# Patient Record
Sex: Female | Born: 1955 | Race: Black or African American | Hispanic: No | Smoking: Never smoker
Health system: Southern US, Community
[De-identification: ages and names within clinical notes are randomized; demographics above are authoritative.]

## PROBLEM LIST (undated history)

## (undated) DIAGNOSIS — H579 Unspecified disorder of eye and adnexa: Secondary | ICD-10-CM

## (undated) DIAGNOSIS — E119 Type 2 diabetes mellitus without complications: Secondary | ICD-10-CM

## (undated) DIAGNOSIS — E785 Hyperlipidemia, unspecified: Secondary | ICD-10-CM

## (undated) HISTORY — DX: Unspecified disorder of eye and adnexa: H57.9

## (undated) HISTORY — PX: KNEE SURGERY: SHX244

## (undated) HISTORY — DX: Type 2 diabetes mellitus without complications: E11.9

## (undated) HISTORY — PX: REDUCTION MAMMAPLASTY: SUR839

## (undated) HISTORY — DX: Hyperlipidemia, unspecified: E78.5

## (undated) HISTORY — PX: ABDOMINAL HYSTERECTOMY: SHX81

## (undated) HISTORY — PX: BREAST BIOPSY: SHX20

---

## 1998-09-06 ENCOUNTER — Emergency Department (HOSPITAL_COMMUNITY): Admission: EM | Admit: 1998-09-06 | Discharge: 1998-09-06 | Payer: Self-pay | Admitting: Emergency Medicine

## 1998-09-06 ENCOUNTER — Encounter: Payer: Self-pay | Admitting: Emergency Medicine

## 1999-11-29 ENCOUNTER — Ambulatory Visit (HOSPITAL_COMMUNITY): Admission: RE | Admit: 1999-11-29 | Discharge: 1999-11-29 | Payer: Self-pay | Admitting: Family Medicine

## 1999-11-29 ENCOUNTER — Encounter: Payer: Self-pay | Admitting: Family Medicine

## 2000-03-16 ENCOUNTER — Encounter: Admission: RE | Admit: 2000-03-16 | Discharge: 2000-04-23 | Payer: Self-pay | Admitting: Family Medicine

## 2000-04-03 ENCOUNTER — Ambulatory Visit: Admission: RE | Admit: 2000-04-03 | Discharge: 2000-04-03 | Payer: Self-pay | Admitting: Pulmonary Disease

## 2000-04-03 ENCOUNTER — Encounter: Payer: Self-pay | Admitting: Pulmonary Disease

## 2000-05-03 ENCOUNTER — Ambulatory Visit: Admission: RE | Admit: 2000-05-03 | Discharge: 2000-05-03 | Payer: Self-pay | Admitting: Pulmonary Disease

## 2002-08-22 ENCOUNTER — Inpatient Hospital Stay (HOSPITAL_COMMUNITY): Admission: EM | Admit: 2002-08-22 | Discharge: 2002-08-24 | Payer: Self-pay | Admitting: Family Medicine

## 2003-06-05 ENCOUNTER — Other Ambulatory Visit: Admission: RE | Admit: 2003-06-05 | Discharge: 2003-06-05 | Payer: Self-pay | Admitting: *Deleted

## 2003-06-23 ENCOUNTER — Encounter: Payer: Self-pay | Admitting: *Deleted

## 2003-06-23 ENCOUNTER — Ambulatory Visit (HOSPITAL_COMMUNITY): Admission: RE | Admit: 2003-06-23 | Discharge: 2003-06-23 | Payer: Self-pay | Admitting: *Deleted

## 2003-07-01 ENCOUNTER — Other Ambulatory Visit: Admission: RE | Admit: 2003-07-01 | Discharge: 2003-07-01 | Payer: Self-pay | Admitting: *Deleted

## 2003-08-17 ENCOUNTER — Encounter: Payer: Self-pay | Admitting: *Deleted

## 2003-08-17 ENCOUNTER — Ambulatory Visit (HOSPITAL_COMMUNITY): Admission: RE | Admit: 2003-08-17 | Discharge: 2003-08-17 | Payer: Self-pay | Admitting: *Deleted

## 2003-10-27 ENCOUNTER — Encounter (INDEPENDENT_AMBULATORY_CARE_PROVIDER_SITE_OTHER): Payer: Self-pay | Admitting: *Deleted

## 2003-10-27 ENCOUNTER — Inpatient Hospital Stay (HOSPITAL_COMMUNITY): Admission: RE | Admit: 2003-10-27 | Discharge: 2003-10-30 | Payer: Self-pay | Admitting: *Deleted

## 2006-01-21 ENCOUNTER — Emergency Department (HOSPITAL_COMMUNITY): Admission: EM | Admit: 2006-01-21 | Discharge: 2006-01-21 | Payer: Self-pay | Admitting: Emergency Medicine

## 2006-04-04 ENCOUNTER — Encounter: Payer: Self-pay | Admitting: Emergency Medicine

## 2006-10-19 ENCOUNTER — Emergency Department (HOSPITAL_COMMUNITY): Admission: EM | Admit: 2006-10-19 | Discharge: 2006-10-19 | Payer: Self-pay | Admitting: Emergency Medicine

## 2010-12-22 ENCOUNTER — Emergency Department (HOSPITAL_COMMUNITY)
Admission: EM | Admit: 2010-12-22 | Discharge: 2010-12-23 | Payer: Self-pay | Source: Home / Self Care | Admitting: Emergency Medicine

## 2010-12-24 ENCOUNTER — Encounter: Payer: Self-pay | Admitting: Oncology

## 2010-12-26 LAB — CBC
MCH: 27.6 pg (ref 26.0–34.0)
MCHC: 33.6 g/dL (ref 30.0–36.0)
MCV: 82.1 fL (ref 78.0–100.0)
Platelets: 329 10*3/uL (ref 150–400)

## 2010-12-26 LAB — DIFFERENTIAL
Basophils Relative: 1 % (ref 0–1)
Eosinophils Absolute: 0.1 10*3/uL (ref 0.0–0.7)
Monocytes Absolute: 0.5 10*3/uL (ref 0.1–1.0)
Monocytes Relative: 6 % (ref 3–12)

## 2010-12-26 LAB — POCT I-STAT, CHEM 8
BUN: 14 mg/dL (ref 6–23)
Chloride: 102 mEq/L (ref 96–112)
TCO2: 24 mmol/L (ref 0–100)

## 2010-12-26 LAB — POCT CARDIAC MARKERS
CKMB, poc: <1 ng/mL — ABNORMAL LOW (ref 1.0–8.0)
Myoglobin, poc: 33.6 ng/mL (ref 12–200)
Troponin i, poc: <0.05 ng/mL (ref 0.00–0.09)

## 2011-04-09 ENCOUNTER — Emergency Department (HOSPITAL_COMMUNITY)
Admission: EM | Admit: 2011-04-09 | Discharge: 2011-04-10 | Disposition: A | Discharge status: Home / Self Care | Payer: BC Managed Care – PPO | Attending: Emergency Medicine | Admitting: Emergency Medicine

## 2011-04-09 DIAGNOSIS — R112 Nausea with vomiting, unspecified: Secondary | ICD-10-CM | POA: Insufficient documentation

## 2011-04-09 DIAGNOSIS — R197 Diarrhea, unspecified: Secondary | ICD-10-CM | POA: Insufficient documentation

## 2011-04-09 DIAGNOSIS — R51 Headache: Secondary | ICD-10-CM | POA: Insufficient documentation

## 2011-04-09 DIAGNOSIS — Z79899 Other long term (current) drug therapy: Secondary | ICD-10-CM | POA: Insufficient documentation

## 2011-04-09 DIAGNOSIS — E119 Type 2 diabetes mellitus without complications: Secondary | ICD-10-CM | POA: Insufficient documentation

## 2011-04-09 DIAGNOSIS — Z794 Long term (current) use of insulin: Secondary | ICD-10-CM | POA: Insufficient documentation

## 2011-04-10 ENCOUNTER — Emergency Department (HOSPITAL_COMMUNITY): Payer: BC Managed Care – PPO

## 2011-04-10 LAB — BASIC METABOLIC PANEL
BUN: 8 mg/dL (ref 6–23)
Calcium: 9.6 mg/dL (ref 8.4–10.5)
Glucose, Bld: 280 mg/dL — ABNORMAL HIGH (ref 70–99)
Potassium: 3.8 mEq/L (ref 3.5–5.1)

## 2011-04-10 LAB — DIFFERENTIAL
Basophils Absolute: 0 10*3/uL (ref 0.0–0.1)
Eosinophils Absolute: 0.1 10*3/uL (ref 0.0–0.7)
Eosinophils Relative: 1 % (ref 0–5)
Lymphs Abs: 2.3 10*3/uL (ref 0.7–4.0)
Monocytes Absolute: 0.4 10*3/uL (ref 0.1–1.0)

## 2011-04-10 LAB — POCT CARDIAC MARKERS
CKMB, poc: <1 ng/mL — ABNORMAL LOW (ref 1.0–8.0)
Myoglobin, poc: 50 ng/mL (ref 12–200)
Troponin i, poc: <0.05 ng/mL (ref 0.00–0.09)

## 2011-04-10 LAB — URINALYSIS, ROUTINE W REFLEX MICROSCOPIC
Bilirubin Urine: NEGATIVE
Glucose, UA: >1000 mg/dL — AB
Hgb urine dipstick: NEGATIVE
Ketones, ur: >80 mg/dL — AB
Specific Gravity, Urine: 1.037 — ABNORMAL HIGH (ref 1.005–1.030)
pH: 6 (ref 5.0–8.0)

## 2011-04-10 LAB — CBC
MCHC: 34.7 g/dL (ref 30.0–36.0)
MCV: 82.4 fL (ref 78.0–100.0)
Platelets: 264 10*3/uL (ref 150–400)
RDW: 12.7 % (ref 11.5–15.5)
WBC: 7.5 10*3/uL (ref 4.0–10.5)

## 2011-04-10 LAB — URINE MICROSCOPIC-ADD ON

## 2011-04-10 LAB — GLUCOSE, CAPILLARY: Glucose-Capillary: 286 mg/dL — ABNORMAL HIGH (ref 70–99)

## 2011-04-21 NOTE — H&P (Signed)
NAME:  Kathleen Reid, Kathleen Reid                      ACCOUNT NO.:  [id number]   MEDICAL RECORD NO.:  [id number]                   PATIENT TYPE:  INP   LOCATION:  NA                                   FACILITY:  [facility]   PHYSICIAN:  Leland B. Earlene Plater, M.D.               DATE OF BIRTH:  07-02-1956   DATE OF ADMISSION:  10/27/2003  DATE OF DISCHARGE:                                HISTORY & PHYSICAL   CHIEF COMPLAINT:  Menorrhagia, uterine fibroids versus adenomyosis.   PROCEDURE:  Total abdominal hysterectomy, possible bilateral salpingo-  oophorectomy.   HISTORY OF PRESENT ILLNESS:  This is a 55 year old African American female  gravida 3, para 2, A1.  History of menorrhagia and anemia.  Has heavy  monthly bleeding and associated dysmenorrhea.  Pelvic ultrasound in the  office was suggestive of multiple uterine fibroids.  The patient was  considering uterine artery embolization.  MRI was performed in this regard  and was most suggestive of adenomyosis as opposed to discreet fibroids.  Therefore, patient would like to proceed with a hysterectomy.   Recently status post LEEP for CIN III with positive margin on the  ectocervical edge.  Preoperative endometrial biopsy was within normal  limits.   PAST MEDICAL HISTORY:  1. Anemia.  2. Palpitations.  3. Reflux.   PAST SURGICAL HISTORY:  1. Eye surgery.  2. LEEP.   MEDICATIONS:  1. Protonix 40 mg p.o. daily.  2. Atenolol 50 mg p.r.n. palpitations.   SOCIAL HISTORY:  No alcohol, tobacco, or other drugs.   FAMILY HISTORY:  Diabetes, heart disease, high blood pressure, breast  cancer, and thyroid cancer.   REVIEW OF SYSTEMS:  Otherwise noncontributory.   PHYSICAL EXAMINATION:  VITAL SIGNS:  Blood pressure 112/80, pulse 92, weight  200 pounds.  GENERAL:  Alert and oriented, in no acute distress.  SKIN:  Warm, dry, no lesions.  HEART:  Regular rate and rhythm.  LUNGS:  Clear to auscultation.  ABDOMEN:  Liver and spleen are normal.  No  hernias.  Uterus is palpable to  just below the umbilicus approximately 16-18 weeks size consistent with  fibroids.  PELVIC:  Normal external genitalia.  Vagina and cervix are clear.  Uterus  palpable as outlined above consistent with 16-18 weeks size fibroid uterus.  No adnexal masses palpable.  Urethra, bladder base, anus, perineum all  normal.   ASSESSMENT:  Menorrhagia, dysmenorrhea, and uterine fibroids versus  adenomyosis.  History of cervical dysplasia treated with loop  electrosurgical excision procedure.  Alternative treatments were discussed  including uterine artery embolization; however, patient desires definitive  therapy with hysterectomy.  Given the large size of her uterus she will  require a vertical midline incision and patient has been informed of this  preoperatively.  Operative risks discussed including infection, bleeding,  damage to surrounding organs, delayed healing, and deep venous thrombosis.  All questions answered.  The patient wishes to proceed.  Gerri Spore B. Earlene Plater, M.D.    WBD/MEDQ  D:  10/20/2003  T:  10/20/2003  Job:  [id number]   cc:   Pierce Crane, M.D.  [address] - [facility]  [place]  [place] [address]  Fax: [phone]

## 2011-04-21 NOTE — Discharge Summary (Signed)
NAME:  Kathleen Reid, Kathleen Reid                      ACCOUNT NO.:  [id number]   MEDICAL RECORD NO.:  [id number]                   PATIENT TYPE:  INP   LOCATION:  9306                                 [facility]ACILITY:  [facility]   PHYSICIAN:  Gerri Spore B. Earlene Plater, M.D.               DATE OF BIRTH:  08/05/1956   DATE OF ADMISSION:  10/27/2003  DATE OF DISCHARGE:  10/30/2003                                 DISCHARGE SUMMARY   ADMISSION DIAGNOSIS:  Symptomatic uterine fibroids.   DISCHARGE DIAGNOSIS:  Symptomatic uterine fibroids.   PROCEDURE:  Total abdominal hysterectomy.   HISTORY OF PRESENT ILLNESS:  For complete details please see the History and  Physical in the chart.  Briefly, the patient presented for definitive  treatment of symptomatic uterine fibroids with associated menorrhagia.   HOSPITAL COURSE:  On day of admission the patient underwent total abdominal  hysterectomy.  Findings at the time of surgery included changes of the  uterus consistent with fibroids and adenomyosis.   Postoperatively, the patient rapidly regained her ability to ambulate, void,  and tolerate a regular diet.  She did have fever on postoperative day #2.  Chest x-ray and blood cultures were negative and this resolved with  incentive spirometry.  She was discharged home on postoperative day #3 in  satisfactory condition.   DISCHARGE MEDICATIONS:  1. Tylox one to two p.o. q.4-6h. p.r.n. pain.  2. Motrin 600 mg one p.o. q.6h. p.r.n. pain.   FOLLOW-UP:  [facility], four weeks.   DISCHARGE INSTRUCTIONS:  1. No heavy lifting.  2. Nothing in the vagina for six weeks.  3. Call with fever, pain, nausea, vomiting, or other concerns.                                               Gerri Spore B. Earlene Plater, M.D.    WBD/MEDQ  D:  12/03/2003  T:  12/03/2003  Job:  [id number]

## 2011-04-21 NOTE — Discharge Summary (Signed)
NAME:  Kathleen Reid, Kathleen Reid                      ACCOUNT NO.:  [id number]   MEDICAL RECORD NO.:  [id number]                   PATIENT TYPE:  INP   LOCATION:  0365                                 FACILITY:  [facility]   PHYSICIAN:  Elana Alm. Nicholos Johns, M.D.               DATE OF BIRTH:  02/01/1956   DATE OF ADMISSION:  08/22/2002  DATE OF DISCHARGE:  08/24/2002                                 DISCHARGE SUMMARY   DISCHARGE DIAGNOSES:  1. Iron deficiency anemia.  2. Elevated blood sugars.   HISTORY OF PRESENT ILLNESS:  The patient was referred to the hospital by her  primary care physician, after workup of fatigue and shortness of breath  revealed a hemoglobin of 6.2.  She denied chest pain, abdominal pain, change  in the color of her stool; but had complained of some palpitations,  especially when going from sitting to standing.  She has been short of  breath with exertion for the past several minutes, but had been more so in  the past two weeks.  She has been having menorrhagia since May 2003; she  uses approximately one pad a day and during her 7 day monthly cycle she goes  through about nine pads a day.  This has been going on for the past five  months.  She thought maybe her stool was darker, although she still said it  as brown.  She does have some reflux symptoms as well, although this has  improved more recently with the addition of a PPI by her primary care  physician.  She did complain of some dry skin, but no constipation.  She has  had some weight loss secondary to her decrease in appetite; but no fevers,  rash or other constitutional symptoms.   PAST MEDICAL HISTORY:  1. Cluster migraines.  2. She has been borderline anemic since a teenager.  3. GERD.   ALLERGIES:  CODEINE (she gets strange reaction).   HEALTH MAINTENANCE:  She had a mammogram four years ago which was negative,  and a Pap two years ago which was also negative.   FAMILY HISTORY:  She has stomach cancer in  both her brother and her maternal  aunt.  Her niece has thyroid cancer.  There is a history of fibroids in her  family as well as coronary artery disease, hypertension and a strong family  history of diabetes.   SOCIAL HISTORY:  She is divorced with two children.  She works as a fifth  Psychiatrist.  She denies use of tobacco or alcohol, and  occasionally nonsteroidal use.  She has been quite active in the past, and  in fact has blacked out in karate.  Of note, she has not been able to do any  of her more strenuous activities (which she was used to) recently because of  fatigue.   PHYSICAL EXAMINATION:  VITAL SIGNS:  Her initial exam was significant  for  stable vital signs; pulse 79, blood pressure 131/80.  GENERAL:  She was pale appearing, but in no respiratory distress.  Sitting  comfortably in bed.  Although, she did get a little dyspneic whenever we  asked her to do any kind of strenuous activity.  HEENT:  Conjunctivae pale, but her pupils are equal, round and reactive to  light and accommodation.  There was no JVD and there is no icterus.  HEART:  Within normal limits.  LUNGS:  Within normal limits.  ABDOMEN:  Soft, nontender with positive bowel sounds.  EXTREMITIES:  She had no rash and no edema.  NEUROLOGIC:  Nonfocal.  RECTAL:  There were no lesions and there was a trace of brown stool, which  was guaiac negative.   INITIAL LABORATORY:  Hematocrit 21.5, with an MCV 55.  Reticulocyte count  1.1.  TSH was within normal limits.  Iron with less than 1%.  Ferritin level  1.0.  BMP -- Within normal limits, except for a blood sugar of 171 (this was  fasting).   HOSPITAL COURSE:  The patient was transfused two units.  Her post-  transfusion hematocrit the next day was 27.1, although she was tired and  getting a little short of breath with activity.  So, she was kept for  monitoring, especially since she did begin to have some vaginal bleeding  during the hospitalization.   Her hematocrit check the next morning was  stable at 28.5.  The patient was feeling much better and was much more  active and able to ambulate.  She was not complaining of any shortness of  breath with any activity.  Finger stick blood sugars were done before each  meal, since her serum blood sugar level was elevated on the labs done on  August 23, 2002.  They did range between 138 and 169, with a fasting one  of 138 in the morning.   The patient was stable and was discharged in stable condition on August 24, 2002.   DISCHARGE PLAN:  1. She is to call Dr. Nicholos Johns tomorrow for a follow-up appointment with him,     in order to recheck her blood count as well as to follow up on her     elevated blood sugars.  2. The patient had a nutrition consult in-house, and was given extensive and     detailed instructions on a diabetic diet.  She is to institute that at     this time.  Follow-up blood work and blood sugars will determine in the     future whether or not she would need the addition of medications.  This     is to be followed up by Dr. Nicholos Johns.  3. She is to call to make a GYN appointment.  She is to call and make an     appointment for next week as her menstrual period is coming up and she     has the potential to drop her blood count again.  She will make an     appointment for this week.  She prefers to make an outpatient     appointment, and Dr. Nicholos Johns will assist with that as well.   DISCHARGE MEDICATIONS:  Iron sulfate tablet 325 mg p.o. b.i.d.  (Dr. Nicholos Johns  will likely want to check a hemoglobin and another reticulocyte count; as  well as a hemoglobin A1C in approximately two weeks time).      Lazaro Arms, MD  Robert A. Nicholos Johns, M.D.    AMC/MEDQ  D:  08/24/2002  T:  08/25/2002  Job:  [id number]

## 2011-04-21 NOTE — Op Note (Signed)
NAME:  Kathleen Reid, Kathleen Reid                      ACCOUNT NO.:  [id number]   MEDICAL RECORD NO.:  [id number]                   PATIENT TYPE:  INP   LOCATION:  9399                                 FACILITY:  [facility]   PHYSICIAN:  Gerri Spore B. Earlene Plater, M.D.               DATE OF BIRTH:  March 11, 1956   DATE OF PROCEDURE:  10/27/2003  DATE OF DISCHARGE:                                 OPERATIVE REPORT   PREOPERATIVE DIAGNOSES:  1. Abnormal bleeding.  2. Uterine fibroids.   POSTOPERATIVE DIAGNOSES:  1. Abnormal bleeding.  2. Uterine fibroids.   PROCEDURES:  1. Total abdominal hysterectomy.  2. Left salpingo-oophorectomy due to bleeding from the left     infundibulopelvic ligament.   SURGEON:  Chester Holstein. Earlene Plater, M.D.   ASSISTANT:  Maxie Better, M.D.   FINDINGS:  A 14-week size uterus consistent with adenomyosis and fibroids.  Bleeder from the left IP necessitating left salpingo-oophorectomy.   SPECIMENS:  Uterus, cervix, and left tube and ovary.   ESTIMATED BLOOD LOSS:  400 mL.   FLUIDS REPLACED:  2100 mL.   URINE:  125 mL.   COMPLICATIONS:  None.   INDICATIONS FOR PROCEDURE:  Patient with a history of abnormal bleeding and  fibroids versus adenomyosis on ultrasound and MRI.  Declined uterine artery  embolization.  Desired ovarian preservation if possible.   DESCRIPTION OF PROCEDURE:  The patient taken to the operating room and  general anesthesia obtained.  She was prepped and draped in sterile fashion  after examination under anesthesia showed above findings.  Foley catheter  inserted in the bladder.   A vertical incision was made with the knife, carried sharply to the fascia.  The fascia was divided sharply and elevated with Kocher clamps.  Posterior  sheath and peritoneum were elevated and entered sharply, extended inferiorly  and sharply with good visualization of the surrounding organs.   Self-retaining retractor placed in the abdomen.  Trendelenburg position  obtained  and bowel packed superiorly with moist packs.  Pelvis inspected  with the above findings noted.   Long Kelly clamp placed on each uterine cornu.   The left utero-ovarian ligament and tube were isolated, doubly clamped,  divided and suture ligated x2 with 0 Vicryl and hemostasis obtained.  Repeated on the opposite side in the same fashion.   Left round ligament was suture ligated with 0 Vicryl and divided.  The  posterior leaf of the broad ligament was divided sharply.  Retroperitoneal  space developed bluntly and the course of the ureter identified.  The  bladder flap created sharply.  The procedure was repeated on the right side  in the same fashion. The left uterine artery was skeletonized and doubly  clamped with curved Heaney clamp, divided sharply and suture ligated with 0  Vicryl with hemostasis obtained.  Repeated on the same right in the same  fashion.   The remainder of the cardinal ligaments were then serially  clamped with  straight Heaney clamp, divided sharply and suture ligated with 0 Vicryl with  hemostasis obtained.  Uterosacral ligaments were clamped with curved Heaney  clamps, divided and suture ligated in similar fashion.  The vaginal angles  were then clamped with curved Heaney clamps and divided sharply.  This  allowed for delivery of the uterus and cervix as an intact specimen.  Each  angle was closed with Heaney stitch of 0 Vicryl and the remainder of the  cuff closed with interrupted figure-of-eight sutures of 0 Vicryl.   Bleeding was noted from the left infundibulopelvic ligament which was brisk.  It was obvious that the left ovary needed to be removed.   The course of the left ureter was again visualized and found to be well  away.  The left IP ligament was then doubly clamped with curved Heaney  clamps, divided sharply and suture ligated x2 with 0 Vicryl with hemostasis  obtained.  The right tube and ovary appeared normal and were hemostatic.   The pelvis  was irrigated and reinspected.  There was a bleeder noted from  between the right vaginal angle and the right uterosacral ligament.  Course  of the ureter again identified and found to be away from the area.  Single  suture of 0 Vicryl in figure-of-eight fashion provided hemostasis.   The remainder of the pelvis was hemostatic.  Therefore, the case was  terminated.   Packs were removed and bowel returned to anatomic position.  The fascia was  closed in a running fashion with double stranded #1 PDS suture.   Subcutaneous tissue was irrigated and made hemostatic.  It was  reapproximated with interrupted #1 plain suture.  Skin was closed with  staples.   The patient tolerated the procedure well with no complications.  She was  taken to the recovery room awake, alert and in stable condition.                                               Gerri Spore B. Earlene Plater, M.D.    WBD/MEDQ  D:  10/27/2003  T:  10/27/2003  Job:  [id number]

## 2013-06-25 ENCOUNTER — Other Ambulatory Visit: Payer: Self-pay | Admitting: *Deleted

## 2013-06-25 MED ORDER — INSULIN GLULISINE 100 UNIT/ML SOLOSTAR PEN: 20.0000 [IU] | PEN_INJECTOR | Freq: Three times a day (TID) | SUBCUTANEOUS | Status: DC

## 2013-07-15 ENCOUNTER — Other Ambulatory Visit: Payer: Self-pay | Admitting: *Deleted

## 2013-07-15 DIAGNOSIS — E119 Type 2 diabetes mellitus without complications: Secondary | ICD-10-CM

## 2013-07-23 ENCOUNTER — Other Ambulatory Visit (INDEPENDENT_AMBULATORY_CARE_PROVIDER_SITE_OTHER): Payer: BC Managed Care – PPO

## 2013-07-23 DIAGNOSIS — E119 Type 2 diabetes mellitus without complications: Secondary | ICD-10-CM

## 2013-07-23 LAB — COMPREHENSIVE METABOLIC PANEL
Alkaline Phosphatase: 65 U/L (ref 39–117)
BUN: 8 mg/dL (ref 6–23)
CO2: 29 mEq/L (ref 19–32)
GFR: 137.9 mL/min (ref >60.00)
Glucose, Bld: 86 mg/dL (ref 70–99)
Total Bilirubin: 0.4 mg/dL (ref 0.3–1.2)
Total Protein: 7.6 g/dL (ref 6.0–8.3)

## 2013-07-23 LAB — URINALYSIS
Bilirubin Urine: NEGATIVE
Hgb urine dipstick: NEGATIVE
Leukocytes, UA: NEGATIVE
Nitrite: NEGATIVE
Total Protein, Urine: NEGATIVE
pH: 6 (ref 5.0–8.0)

## 2013-07-23 LAB — MICROALBUMIN / CREATININE URINE RATIO
Microalb Creat Ratio: 0.3 mg/g (ref 0.0–30.0)
Microalb, Ur: 0.4 mg/dL (ref 0.0–1.9)

## 2013-07-25 ENCOUNTER — Ambulatory Visit: Payer: BC Managed Care – PPO | Admitting: Endocrinology

## 2013-07-30 ENCOUNTER — Encounter: Payer: Self-pay | Admitting: Endocrinology

## 2013-07-30 ENCOUNTER — Ambulatory Visit (INDEPENDENT_AMBULATORY_CARE_PROVIDER_SITE_OTHER): Payer: BC Managed Care – PPO | Admitting: Endocrinology

## 2013-07-30 ENCOUNTER — Other Ambulatory Visit: Payer: Self-pay | Admitting: *Deleted

## 2013-07-30 VITALS — BP 150/80 | HR 79 | Temp 98.8°F | Resp 12 | Ht 66.0 in | Wt 199.3 lb

## 2013-07-30 DIAGNOSIS — IMO0001 Reserved for inherently not codable concepts without codable children: Secondary | ICD-10-CM

## 2013-07-30 MED ORDER — INSULIN ASPART 100 UNIT/ML FLEXPEN: 20.0000 [IU] | PEN_INJECTOR | Freq: Three times a day (TID) | SUBCUTANEOUS | Status: DC

## 2013-07-30 NOTE — Patient Instructions (Addendum)
Resume walking daily  Novolog same doses as Apidra; if sugars within 2-3 hrs are < 140 cut by 2 units  Levemir same dose based  am sugar target 80-130  Increase pm Metformin to 2 tabs

## 2013-07-30 NOTE — Progress Notes (Signed)
Patient ID: Kathleen Reid, female   DOB: 1956-05-28, 57 y.o.   MRN: [id number]  Kathleen Reid is an 57 y.o. female.   Reason for Appointment: Diabetes follow-up   History of Present Illness   Diagnosis: Type 2 DIABETES MELITUS,  long-standing   She has been on a variety of different regimens for diabetes management over the last few years and has had difficulty getting consistent controlled. She has not been seen in followup for about 4 months now. At that time because of inconsistent control and difficulty with weight loss and postprandial control she was given a sample of Victoza to try. She thinks her sugars at that time were better and she was feeling better also but did not fill her prescription Recently has not monitored her blood sugar regularly A1c appears to be higher than usual  Oral hypoglycemic drugs: Metformin 500 mg twice a day only, has not been on a larger dose previously        Side effects from medications: None Insulin regimen: Levemir 22 units daily, Apidra 20 units before meals           Proper timing of medications in relation to meals: Yes.         Monitors blood glucose: Once a day.    Glucometer: Accu-Chek           Blood Glucose readings from meter download: readings before breakfast: 137, 140, nonfasting 94-214 with only 2 readings over 180 which are at lunchtime;  For the last 2 weeks overall average 142.    Hypoglycemia frequency: Never.          Meals: 3 meals per day.          Physical activity: exercise: minimal             Wt Readings from Last 3 Encounters:  07/30/13 199 lb 4.8 oz (90.402 kg)    No visits with results within 1 Week(s) from this visit. Latest known visit with results is:  Appointment on 07/23/2013  Component Date Value Range Status  . Hemoglobin A1C 07/23/2013 8.0* 4.6 - 6.5 % Final   Glycemic Control Guidelines for People with Diabetes:Non Diabetic:  <6%Goal of Therapy: <7%Additional Action Suggested:  >8%   . Sodium  07/23/2013 137  135 - 145 mEq/L Final  . Potassium 07/23/2013 3.9  3.5 - 5.1 mEq/L Final  . Chloride 07/23/2013 103  96 - 112 mEq/L Final  . CO2 07/23/2013 29  19 - 32 mEq/L Final  . Glucose, Bld 07/23/2013 86  70 - 99 mg/dL Final  . BUN 95/62/1308 8  6 - 23 mg/dL Final  . Creatinine, Ser 07/23/2013 0.6  0.4 - 1.2 mg/dL Final  . Total Bilirubin 07/23/2013 0.4  0.3 - 1.2 mg/dL Final  . Alkaline Phosphatase 07/23/2013 65  39 - 117 U/L Final  . AST 07/23/2013 20  0 - 37 U/L Final  . ALT 07/23/2013 21  0 - 35 U/L Final  . Total Protein 07/23/2013 7.6  6.0 - 8.3 g/dL Final  . Albumin 65/78/4696 4.0  3.5 - 5.2 g/dL Final  . Calcium 29/52/8413 9.3  8.4 - 10.5 mg/dL Final  . GFR 24/40/1027 137.90  >60.00 mL/min Final  . Color, Urine 07/23/2013 LT. YELLOW  Yellow;Lt. Yellow Final  . APPearance 07/23/2013 CLEAR  Clear Final  . Specific Gravity, Urine 07/23/2013 1.025  1.000-1.030 Final  . pH 07/23/2013 6.0  5.0 - 8.0 Final  . Total Protein, Urine 07/23/2013  NEGATIVE  Negative Final  . Urine Glucose 07/23/2013 NEGATIVE  Negative Final  . Ketones, ur 07/23/2013 NEGATIVE  Negative Final  . Bilirubin Urine 07/23/2013 NEGATIVE  Negative Final  . Hgb urine dipstick 07/23/2013 NEGATIVE  Negative Final  . Urobilinogen, UA 07/23/2013 1.0  0.0 - 1.0 Final  . Leukocytes, UA 07/23/2013 NEGATIVE  Negative Final  . Nitrite 07/23/2013 NEGATIVE  Negative Final  . Microalb, Ur 07/23/2013 0.4  0.0 - 1.9 mg/dL Final  . Creatinine,U 16/09/9603 129.9   Final  . Microalb Creat Ratio 07/23/2013 0.3  0.0 - 30.0 mg/g Final      Medication List       This list is accurate as of: 07/30/13  3:36 PM.  Always use your most recent med list.               Insulin Glulisine 100 UNIT/ML Sopn  Commonly known as:  APIDRA SOLOSTAR  Inject 20 Units into the skin 3 (three) times daily.     LEVEMIR FLEXTOUCH [place]  Inject 22 Units into the skin daily.     metFORMIN 500 MG tablet  Commonly known as:  GLUCOPHAGE  Take  500 mg by mouth 2 (two) times daily with a meal.     VICTOZA 18 MG/3ML Sopn  Generic drug:  Liraglutide  Inject 1.2 mg into the skin.        Allergies:  Allergies  Allergen Reactions  . Codeine     hallucinations    No past medical history on file.  No past surgical history on file.  No family history on file.  Social History:  reports that she has never smoked. She has never used smokeless tobacco. Her alcohol and drug histories are not on file.  Review of Systems:  No history of hypertension but her blood pressure is high normal today    She has not been on lipid-lowering medications and previous records not available     Examination:   BP 150/80  Pulse 79  Temp(Src) 98.8 F (37.1 C)  Resp 12  Ht 5\' 6"  (1.676 m)  Wt 199 lb 4.8 oz (90.402 kg)  BMI 32.18 kg/m2  SpO2 97%  Body mass index is 32.18 kg/(m^2).   ASSESSMENT/ PLAN::   Diabetes type 2   The patient's diabetes control appears to be poorly controlled but this is from noncompliance with her diet, exercise regimen and stress. Also she has not taken her Victoza after taking the sample on her last visit Since she had reportedly good blood sugars and felt better with taking Victoza and will restart this. Discussed benefits of Victoza, doses titration and timing of injection  Most likely she will still need to continue on basal bolus insulin regimen which she is doing now. However has not checked her blood sugars enough to establish a pattern Will continue on the same dose of Levemir but discussed how to titrate this based on fasting blood sugar trend over 3 days She will also try to reduce her mealtime insulin if postprandial readings are at least below 140 with starting Victoza She is going to start exercise and with a program at the gym and also be consistent with diet She was instructed on glucose monitoring and will review her blood sugars again in 2 months Consider followup session with diabetes educator  also  [facility] 07/30/2013, 3:36 PM

## 2013-08-26 ENCOUNTER — Telehealth: Payer: Self-pay | Admitting: Endocrinology

## 2013-08-26 NOTE — Telephone Encounter (Signed)
Patient is calling because Humalog is causing headache and GI upset.  She would like to know if this can be switched to Lantus?  [facility] [employer] [facility].

## 2013-08-26 NOTE — Telephone Encounter (Signed)
Is she asking about Levemir or Humalog?. I presume she has a followup appointment with me also

## 2013-08-26 NOTE — Telephone Encounter (Signed)
Novolog and she has an upcoming appointment on 09/24/13

## 2013-08-27 NOTE — Telephone Encounter (Signed)
They are not the same kind of insulin and cannot be interchanged

## 2013-08-27 NOTE — Telephone Encounter (Signed)
She would like lantus instead of Novolog

## 2013-08-27 NOTE — Telephone Encounter (Signed)
Left message on voicemail.

## 2013-08-27 NOTE — Telephone Encounter (Signed)
She wants Humalog instead of Novolog??

## 2013-09-17 ENCOUNTER — Other Ambulatory Visit: Payer: BC Managed Care – PPO

## 2013-09-24 ENCOUNTER — Ambulatory Visit: Payer: BC Managed Care – PPO | Admitting: Endocrinology

## 2013-10-02 ENCOUNTER — Telehealth: Payer: Self-pay | Admitting: Endocrinology

## 2013-10-02 ENCOUNTER — Other Ambulatory Visit: Payer: Self-pay | Admitting: *Deleted

## 2013-10-02 MED ORDER — METFORMIN HCL 500 MG PO TABS: 500.0000 mg | ORAL_TABLET | Freq: Two times a day (BID) | ORAL | Status: DC

## 2013-10-02 NOTE — Telephone Encounter (Signed)
Pt says her Pharmacy has been calling to request a refill for Metformin but have not gotten a call back from our office yet. Pharmacy - [facility] on [address] of [address].

## 2013-10-02 NOTE — Telephone Encounter (Signed)
rx sent

## 2013-10-15 ENCOUNTER — Other Ambulatory Visit: Payer: BC Managed Care – PPO

## 2013-10-22 ENCOUNTER — Other Ambulatory Visit (INDEPENDENT_AMBULATORY_CARE_PROVIDER_SITE_OTHER): Payer: BC Managed Care – PPO

## 2013-10-22 ENCOUNTER — Ambulatory Visit: Payer: BC Managed Care – PPO | Admitting: Endocrinology

## 2013-10-22 ENCOUNTER — Other Ambulatory Visit: Payer: Self-pay | Admitting: *Deleted

## 2013-10-22 ENCOUNTER — Other Ambulatory Visit: Payer: BC Managed Care – PPO

## 2013-10-22 DIAGNOSIS — IMO0001 Reserved for inherently not codable concepts without codable children: Secondary | ICD-10-CM

## 2013-10-22 LAB — BASIC METABOLIC PANEL
CO2: 26 mEq/L (ref 19–32)
Calcium: 9.4 mg/dL (ref 8.4–10.5)
Creatinine, Ser: 0.7 mg/dL (ref 0.4–1.2)
GFR: 120.8 mL/min (ref >60.00)
Glucose, Bld: 94 mg/dL (ref 70–99)
Sodium: 134 mEq/L — ABNORMAL LOW (ref 135–145)

## 2013-10-22 MED ORDER — METFORMIN HCL 500 MG PO TABS: ORAL_TABLET | ORAL | Status: DC

## 2013-10-23 ENCOUNTER — Other Ambulatory Visit: Payer: BC Managed Care – PPO

## 2013-10-27 ENCOUNTER — Ambulatory Visit (INDEPENDENT_AMBULATORY_CARE_PROVIDER_SITE_OTHER): Payer: BC Managed Care – PPO | Admitting: Endocrinology

## 2013-10-27 VITALS — BP 128/70 | HR 90 | Temp 98.2°F | Resp 12 | Ht 66.0 in | Wt 203.0 lb

## 2013-10-27 DIAGNOSIS — E119 Type 2 diabetes mellitus without complications: Secondary | ICD-10-CM

## 2013-10-27 NOTE — Patient Instructions (Signed)
NOVOLOG 18 AT MEALS BUT +/- 2 FOR MEAL SIZE  TAKE VICTOZA WITH THE LEVEMIR  AM SUGARS ARE 80-130 RANGE THEN NO CHANGE IN LEVEMIR

## 2013-10-27 NOTE — Progress Notes (Signed)
Patient ID: Kathleen Reid, female   DOB: May 10, 1956, 57 y.o.   MRN: [id number]  Kathleen Reid is an 57 y.o. female.   Reason for Appointment: Diabetes follow-up   History of Present Illness   Diagnosis: Type 2 DIABETES MELITUS,  long-standing  Previous history: She has been on a variety of different regimens for diabetes management over the last few years and has had difficulty getting consistent controlled. She was started on Victoza in 8/14 when her A1c was 8% and had improvement in her blood sugars  RECENT history: She has been somewhat inconsistent with her compliance with both followup and her treatment regimen Although she has monitored her blood sugar more  regularly recently she has not taken her Victoza as directed She takes this only 2 days a week on average as she forgets in the evening  She was under the impression that she has to take it separately from her other injections She thinks it helps her portion control and she subjectively feels better with that also However her fructosamine indicates overall better readings than she has only sporadic high readings in the morning or at lunchtime  Oral hypoglycemic drugs: Metformin 1500 mg daily.        Side effects from medications: None Insulin regimen: Levemir 22 units daily, Novolog 22 units before meals, usually compliant with this            Proper timing of medications in relation to meals: Yes.          Monitors blood glucose: Once a day.    Glucometer: Accu-Chek           Blood Glucose readings from meter download: readings before breakfast:  107-195. Nonfasting 60-157 with most readings midmorning and afternoon For the last  4 weeks overall average days 130   Hypoglycemia frequency: once with skipping meal.          Meals: 3 meals per day.          Physical activity: exercise:  occasional walking            Wt Readings from Last 3 Encounters:  10/27/13 203 lb (92.08 kg)  07/30/13 199 lb 4.8 oz (90.402 kg)    Lab Results  Component Value Date   HGBA1C 8.0* 07/23/2013   Lab Results  Component Value Date   MICROALBUR 0.4 07/23/2013   CREATININE 0.7 10/22/2013    Appointment on 10/22/2013  Component Date Value Range Status  . Sodium 10/22/2013 134* 135 - 145 mEq/L Final  . Potassium 10/22/2013 4.3  3.5 - 5.1 mEq/L Final  . Chloride 10/22/2013 102  96 - 112 mEq/L Final  . CO2 10/22/2013 26  19 - 32 mEq/L Final  . Glucose, Bld 10/22/2013 94  70 - 99 mg/dL Final  . BUN 11/91/4782 8  6 - 23 mg/dL Final  . Creatinine, Ser 10/22/2013 0.7  0.4 - 1.2 mg/dL Final  . Calcium 95/62/1308 9.4  8.4 - 10.5 mg/dL Final  . GFR 65/78/4696 120.80  >60.00 mL/min Final  . Fructosamine 10/22/2013 253  <285 umol/L Final   Comment:                            Variations in levels of serum proteins (albumin and immunoglobulins)                          may affect fructosamine results.  Medication List       This list is accurate as of: 10/27/13 11:41 AM.  Always use your most recent med list.               insulin aspart 100 UNIT/ML Sopn FlexPen  Commonly known as:  novoLOG  Inject 22 Units into the skin 3 (three) times daily with meals.     LEVEMIR FLEXTOUCH   Inject 22 Units into the skin daily.     metFORMIN 500 MG tablet  Commonly known as:  GLUCOPHAGE  Take one tablet in the morning and 2 tablets in the evening.     VICTOZA 18 MG/3ML Sopn  Generic drug:  Liraglutide  Inject 1.2 mg into the skin.        Allergies:  Allergies  Allergen Reactions  . Codeine     hallucinations    No past medical history on file.  No past surgical history on file.  No family history on file.  Social History:  reports that she has never smoked. She has never used smokeless tobacco. Her alcohol and drug histories are not on file.  Review of Systems:  No history of hypertension    She has not been on lipid-lowering medications and no previous records  available     Examination:   BP 128/70  Pulse 90  Temp(Src) 98.2 F (36.8 C)  Resp 12  Ht 5\' 6"  (1.676 m)  Wt 203 lb (92.08 kg)  BMI 32.78 kg/m2  SpO2 96%  Body mass index is 32.78 kg/(m^2).   ASSESSMENT/ PLAN::   Diabetes type 2   The patient's diabetes control appears to be somewhat better overall as judged by her home readings and fructosamine A1c not done recently She has gained weight and has not been exercising Also has been noncompliant with Victoza because of forgetting in the evening She is generally compliant with her basal bolus insulin regimen but is still requiring large doses of mealtime coverage   She was instructed on taking Victoza at the same time as her evening insulin and take it daily   To do more readings after supper   Also encouraged her to exercise more regularly   Need lipid panel on her next visit  Consider followup session with diabetes educator also  [facility] 10/27/2013, 11:41 AM

## 2013-12-24 ENCOUNTER — Encounter: Payer: Self-pay | Admitting: *Deleted

## 2013-12-24 ENCOUNTER — Encounter: Payer: BC Managed Care – PPO | Attending: Endocrinology | Admitting: *Deleted

## 2013-12-24 VITALS — Ht 66.0 in | Wt 194.1 lb

## 2013-12-24 DIAGNOSIS — E119 Type 2 diabetes mellitus without complications: Secondary | ICD-10-CM | POA: Insufficient documentation

## 2013-12-24 DIAGNOSIS — Z713 Dietary counseling and surveillance: Secondary | ICD-10-CM | POA: Insufficient documentation

## 2013-12-24 NOTE — Patient Instructions (Signed)
Plan:  Aim for 3 Carb Choices per meal (45 grams) +/- 1 either way  Aim for 0-1 Carbs per snack if hungry Consider including protein with meals and snacks Consider reading food labels for Total Carbohydrate of foods Continue with your activity level every other day as tolerated Continue checking your BG daily at alternate times of day as directed by MD

## 2013-12-24 NOTE — Progress Notes (Signed)
Appt start time: 1130 end time:  1300.  Assessment:  Patient was seen on  12/25/13 for individual diabetes education. Lives alone so she shops and cooks her own food. 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 Now going to gym 3 times a week for 45 minutes to an hour doing Zumba, weights, a cardio class and swimming. SMBG more often now @ 3 times a day with reported range of 80-144 mg/dl including pre and post meals. She enjoys swimming and traveling.  Current HbA1c: 8.0% in August, 2014  Preferred Learning Style: all of the following  Auditory  Visual  Hands on  Learning Readiness:   Ready  Change in progress  MEDICATIONS: see list. Diabetes medications include Levemir, Novolog, Victoza  DIETARY INTAKE:  24-hr recall:  B ( AM): skips often, boiled egg OR 1 pkg flavored instant oatmeal OR 1 apple OR 1 pkg  PNB crackers, water  Snk ( AM): occasionally a fresh fruit  L ( PM): salad with meat and vegetables, OR baked chicken OR small pot pie OR vegetable soup,  water   Snk ( PM): occasionally fresh fruit D ( PM): salad with meat and vegetables again OR meat, vegetable take out from restaurant. She limits the starch foods Snk ( PM): crackers OR fruit Beverages: water now  Usual physical activity: gym 3 days a week consistently over the past 2 months  Estimated energy needs: 1400 calories 158 g carbohydrates 105 g protein 39 g fat  Intervention:  Nutrition counseling provided.  Discussed diabetes disease process and treatment options.  Discussed physiology of diabetes and role of obesity on insulin resistance.  Encouraged moderate weight reduction to improve glucose levels.  Discussed role of medications and diet in glucose control  Provided education on macronutrients on glucose levels.  Provided education on carb counting, importance of regularly scheduled meals/snacks, and meal planning  Discussed effects of physical activity on glucose levels and long-term glucose control.  Commended her on her 150 minutes of physical activity/week.  Reviewed patient medications.  Discussed role of medication on blood glucose and possible side effects  Discussed blood glucose monitoring and interpretation.  Discussed recommended target ranges and individual ranges.    Described short-term complications: hyper- and hypo-glycemia.  Discussed causes,symptoms, and treatment options.  Discussed prevention, detection, and treatment of long-term complications.  Discussed the role of prolonged elevated glucose levels on body systems.  Discussed role of stress on blood glucose levels and discussed strategies to manage psychosocial issues.  Discussed recommendations for long-term diabetes self-care.  Established checklist for medical, dental, and emotional self-care.  Plan:  Aim for 3 Carb Choices per meal (45 grams) +/- 1 either way  Aim for 0-1 Carbs per snack if hungry Consider including protein with meals and snacks Consider reading food labels for Total Carbohydrate of foods Continue with your activity level every other day as tolerated Continue checking your BG daily at alternate times of day as directed by MD  Teaching Method Utilized: Visual, Auditory and Hands on  Handouts given during visit include: Living Well with Diabetes Carb Counting and Food Label handouts Meal Plan Card  Diabetes medication handouts  Barriers to learning/adherence to lifestyle change: none  Diabetes self-care support plan:   [facility] support group  Demonstrated degree of understanding via:  Teach Back   Monitoring/Evaluation:  Dietary intake, exercise, reading food labels, and body weight in 6 week(s).

## 2013-12-31 ENCOUNTER — Other Ambulatory Visit: Payer: Self-pay | Admitting: *Deleted

## 2013-12-31 MED ORDER — INSULIN PEN NEEDLE 31G X 8 MM MISC: Status: DC

## 2014-02-04 ENCOUNTER — Other Ambulatory Visit: Payer: BC Managed Care – PPO

## 2014-02-05 ENCOUNTER — Other Ambulatory Visit: Payer: BC Managed Care – PPO

## 2014-02-05 ENCOUNTER — Ambulatory Visit: Payer: BC Managed Care – PPO | Admitting: *Deleted

## 2014-02-09 ENCOUNTER — Ambulatory Visit: Payer: BC Managed Care – PPO | Admitting: Endocrinology

## 2014-04-28 ENCOUNTER — Other Ambulatory Visit: Payer: Self-pay | Admitting: *Deleted

## 2014-04-28 MED ORDER — INSULIN ASPART 100 UNIT/ML FLEXPEN: 22.0000 [IU] | PEN_INJECTOR | Freq: Three times a day (TID) | SUBCUTANEOUS | Status: DC

## 2014-06-17 ENCOUNTER — Other Ambulatory Visit: Payer: Self-pay | Admitting: *Deleted

## 2014-06-17 MED ORDER — INSULIN ASPART 100 UNIT/ML FLEXPEN: 22.0000 [IU] | PEN_INJECTOR | Freq: Three times a day (TID) | SUBCUTANEOUS | Status: DC

## 2014-07-13 ENCOUNTER — Other Ambulatory Visit: Payer: BC Managed Care – PPO

## 2014-07-16 ENCOUNTER — Ambulatory Visit: Payer: BC Managed Care – PPO | Admitting: Endocrinology

## 2014-07-29 ENCOUNTER — Other Ambulatory Visit: Payer: BC Managed Care – PPO

## 2014-07-31 ENCOUNTER — Telehealth: Payer: Self-pay | Admitting: Endocrinology

## 2014-07-31 ENCOUNTER — Other Ambulatory Visit: Payer: Self-pay | Admitting: *Deleted

## 2014-07-31 MED ORDER — INSULIN ASPART 100 UNIT/ML FLEXPEN: 22.0000 [IU] | PEN_INJECTOR | Freq: Three times a day (TID) | SUBCUTANEOUS | Status: DC

## 2014-07-31 MED ORDER — METFORMIN HCL 500 MG PO TABS: ORAL_TABLET | ORAL | Status: DC

## 2014-07-31 NOTE — Telephone Encounter (Signed)
Pt needs novolog 22 u with meals and the metformin 3 times a day

## 2014-08-05 ENCOUNTER — Ambulatory Visit: Payer: BC Managed Care – PPO | Admitting: Endocrinology

## 2014-08-20 ENCOUNTER — Other Ambulatory Visit: Payer: BC Managed Care – PPO

## 2014-08-26 ENCOUNTER — Ambulatory Visit: Payer: BC Managed Care – PPO | Admitting: Endocrinology

## 2014-08-27 ENCOUNTER — Other Ambulatory Visit: Payer: BC Managed Care – PPO

## 2014-09-10 ENCOUNTER — Ambulatory Visit: Payer: BC Managed Care – PPO | Admitting: Endocrinology

## 2014-09-21 ENCOUNTER — Other Ambulatory Visit: Payer: BC Managed Care – PPO

## 2014-09-24 ENCOUNTER — Ambulatory Visit: Payer: BC Managed Care – PPO | Admitting: Endocrinology

## 2014-10-08 ENCOUNTER — Other Ambulatory Visit (INDEPENDENT_AMBULATORY_CARE_PROVIDER_SITE_OTHER): Payer: BC Managed Care – PPO

## 2014-10-08 ENCOUNTER — Telehealth: Payer: Self-pay | Admitting: Endocrinology

## 2014-10-08 ENCOUNTER — Other Ambulatory Visit: Payer: Self-pay | Admitting: *Deleted

## 2014-10-08 DIAGNOSIS — E119 Type 2 diabetes mellitus without complications: Secondary | ICD-10-CM

## 2014-10-08 LAB — URINALYSIS, ROUTINE W REFLEX MICROSCOPIC
BILIRUBIN URINE: NEGATIVE
HGB URINE DIPSTICK: NEGATIVE
Ketones, ur: NEGATIVE
NITRITE: NEGATIVE
Specific Gravity, Urine: 1.015 (ref 1.000–1.030)
Total Protein, Urine: NEGATIVE
Urine Glucose: NEGATIVE
Urobilinogen, UA: 2 — AB (ref 0.0–1.0)
pH: 7 (ref 5.0–8.0)

## 2014-10-08 LAB — HEMOGLOBIN A1C: Hgb A1c MFr Bld: 7.8 % — ABNORMAL HIGH (ref 4.6–6.5)

## 2014-10-08 MED ORDER — INSULIN ASPART 100 UNIT/ML FLEXPEN: 22.0000 [IU] | PEN_INJECTOR | Freq: Three times a day (TID) | SUBCUTANEOUS | Status: DC

## 2014-10-08 NOTE — Telephone Encounter (Signed)
Patient is out of her Novalog for the weekend, wanting to know if something can be sent in to get her through the weekend until Monday.

## 2014-10-08 NOTE — Telephone Encounter (Signed)
rx sent for 30 day supply only 

## 2014-10-12 ENCOUNTER — Ambulatory Visit: Payer: BC Managed Care – PPO | Admitting: Endocrinology

## 2014-11-05 ENCOUNTER — Encounter: Payer: Self-pay | Admitting: Endocrinology

## 2014-11-05 ENCOUNTER — Ambulatory Visit (INDEPENDENT_AMBULATORY_CARE_PROVIDER_SITE_OTHER): Payer: BC Managed Care – PPO | Admitting: Endocrinology

## 2014-11-05 VITALS — BP 134/86 | HR 83 | Temp 97.9°F | Resp 14 | Ht 66.0 in | Wt 193.8 lb

## 2014-11-05 DIAGNOSIS — E785 Hyperlipidemia, unspecified: Secondary | ICD-10-CM | POA: Insufficient documentation

## 2014-11-05 DIAGNOSIS — IMO0002 Reserved for concepts with insufficient information to code with codable children: Secondary | ICD-10-CM

## 2014-11-05 DIAGNOSIS — E1165 Type 2 diabetes mellitus with hyperglycemia: Secondary | ICD-10-CM

## 2014-11-05 NOTE — Progress Notes (Signed)
Patient ID: Kathleen LernerSandra G Reid, female   DOB: 11/03/1956, 58 y.o.   MRN: [id number]    Reason for Appointment: Diabetes follow-up   History of Present Illness   Diagnosis: Type 2 DIABETES MELITUS,  long-standing  Previous history: She has been on a variety of different regimens for diabetes management over the last few years and has had difficulty getting consistent controlled. She was started on Victoza in 8/14 when her A1c was 8% and had improvement in her blood sugars  RECENT history:  She has been noncompliant with her follow-up and has not been seen in a year now Previously had difficulties with compliance with her treatment regimen especially with taking Victoza Also she has not checked her blood sugars much at all She has not changed her insulin regimen However she has been irregular with her Victoza recently since when she had a stomach virus she was having cramps and she left off her Victoza.  Has started back only a week ago and taking only 0.6 mg Most likely with this and inconsistent diet her blood sugars recently have been around 200 She is trying to take her Victoza little more regularly now around 7 PM A1c is only slightly better a month ago and still not at target Her weight is about the same this year but better than last year  Oral hypoglycemic drugs: Metformin 1500 mg daily.        Side effects from medications: None Insulin regimen: Levemir 22 units daily, Novolog 22 units before meals, usually compliant with this            Proper timing of medications in relation to meals: Yes.          Monitors blood glucose:  irregularly     Glucometer: Accu-Chek           Blood Glucose readings from meter download: She has only 6 readings in the last month Fasting readings 126-172, nonfasting 217 at lunch, 102 and 221 at bedtime Average 166 Hypoglycemia frequency: none           Meals: 2-3 meals per day.  Generally not eating breakfast  Physical activity: exercise: walking  and at Gym recently            Wt Readings from Last 3 Encounters:  11/05/14 193 lb 12.8 oz (87.907 kg)  12/24/13 194 lb 1.6 oz (88.043 kg)  10/27/13 203 lb (92.08 kg)   Lab Results  Component Value Date   HGBA1C 7.8* 10/08/2014   HGBA1C 8.0* 07/23/2013   Lab Results  Component Value Date   MICROALBUR 0.4 07/23/2013   CREATININE 0.7 10/22/2013    No visits with results within 1 Week(s) from this visit. Latest known visit with results is:  Appointment on 10/08/2014  Component Date Value Ref Range Status  . Hgb A1c MFr Bld 10/08/2014 7.8* 4.6 - 6.5 % Final   Glycemic Control Guidelines for People with Diabetes:Non Diabetic:  <6%Goal of Therapy: <7%Additional Action Suggested:  >8%   . Color, Urine 10/08/2014 YELLOW  Yellow;Lt. Yellow Final  . APPearance 10/08/2014 CLEAR  Clear Final  . Specific Gravity, Urine 10/08/2014 1.015  1.000-1.030 Final  . pH 10/08/2014 7.0  5.0 - 8.0 Final  . Total Protein, Urine 10/08/2014 NEGATIVE  Negative Final  . Urine Glucose 10/08/2014 NEGATIVE  Negative Final  . Ketones, ur 10/08/2014 NEGATIVE  Negative Final  . Bilirubin Urine 10/08/2014 NEGATIVE  Negative Final  . Hgb urine dipstick 10/08/2014 NEGATIVE  Negative Final  .  Urobilinogen, UA 10/08/2014 2.0* 0.0 - 1.0 Final  . Leukocytes, UA 10/08/2014 LARGE* Negative Final  . Nitrite 10/08/2014 NEGATIVE  Negative Final  . WBC, UA 10/08/2014 11-20/hpf* 0-2/hpf Final  . RBC / HPF 10/08/2014 0-2/hpf  0-2/hpf Final  . Mucus, UA 10/08/2014 Presence of* None Final  . Squamous Epithelial / LPF 10/08/2014 Few(5-10/hpf)* Rare(0-4/hpf) Final  . Bacteria, UA 10/08/2014 Rare(<10/hpf)* None Final      Medication List       This list is accurate as of: 11/05/14  4:05 PM.  Always use your most recent med list.               insulin aspart 100 UNIT/ML FlexPen  Commonly known as:  NOVOLOG  Inject 22 Units into the skin 3 (three) times daily with meals.     Insulin Pen Needle 31G X 8 MM Misc   Commonly known as:  B-D ULTRAFINE III SHORT PEN  Use as directed with Levemir Flexpen     LEVEMIR FLEXTOUCH [place]  Inject 22 Units into the skin daily.     metFORMIN 500 MG tablet  Commonly known as:  GLUCOPHAGE  Take one tablet in the morning and 2 tablets in the evening.     VICTOZA 18 MG/3ML Sopn  Generic drug:  Liraglutide  Inject 1.2 mg into the skin.     Vitamin D (Ergocalciferol) 50000 UNITS Caps capsule  Commonly known as:  DRISDOL        Allergies:  Allergies  Allergen Reactions  . Codeine     hallucinations    Past Medical History  Diagnosis Date  . Diabetes mellitus without complication     No past surgical history on file.  No family history on file.  Social History:  reports that she has never smoked. She has never used smokeless tobacco. Her alcohol and drug histories are not on file.  Review of Systems:  No history of hypertension    She has not been on lipid-lowering medications and no previous records available  No results found for: CHOL, HDL, LDLCALC, LDLDIRECT, TRIG, CHOLHDL  No recent UTI, had asymptomatic pyuria on recent exam     Examination:   BP 134/86 mmHg  Pulse 83  Temp(Src) 97.9 F (36.6 C)  Resp 14  Ht 5\' 6"  (1.676 m)  Wt 193 lb 12.8 oz (87.907 kg)  BMI 31.30 kg/m2  SpO2 97%  Body mass index is 31.3 kg/(m^2).   ASSESSMENT/ PLAN::   Diabetes type 2   The patient's diabetes control appears to be inconsistent and A1c is still not at target of 7% She has checked her blood sugars very infrequently at home and difficult to get an idea if she needs adjustment of her insulin She is better compliant with Victoza compared to last year but recently has been irregular with this because of GI problems She is generally compliant with her basal bolus insulin regimen but is still requiring large doses of mealtime coverage   She was instructed on starting Victoza 1.2 mg now at the same time daily  To do more readings at various times  including after supper   Consistent diet  Bring blood sugar records on the next visit for review  Need lipid panel on her next visit   [name] 11/05/2014, 4:05 PM

## 2014-11-05 NOTE — Patient Instructions (Signed)
Please check blood sugars at least half the time about 2 hours after any meal and 3 times per week on waking up. Please bring blood sugar monitor to each visit  Take 1.2mg  Victoza daily

## 2014-12-07 ENCOUNTER — Other Ambulatory Visit: Payer: Self-pay | Admitting: *Deleted

## 2014-12-07 MED ORDER — INSULIN ASPART 100 UNIT/ML FLEXPEN: 22.0000 [IU] | PEN_INJECTOR | Freq: Three times a day (TID) | SUBCUTANEOUS | Status: DC

## 2014-12-11 ENCOUNTER — Other Ambulatory Visit: Payer: Self-pay | Admitting: *Deleted

## 2014-12-11 ENCOUNTER — Telehealth: Payer: Self-pay | Admitting: Endocrinology

## 2014-12-11 MED ORDER — INSULIN ASPART 100 UNIT/ML FLEXPEN: 22.0000 [IU] | PEN_INJECTOR | Freq: Three times a day (TID) | SUBCUTANEOUS | Status: DC

## 2014-12-11 MED ORDER — GLUCOSE BLOOD VI STRP: ORAL_STRIP | Status: DC

## 2014-12-11 NOTE — Telephone Encounter (Signed)
Test strips and novolog called into [facility] on [address]

## 2014-12-11 NOTE — Telephone Encounter (Signed)
rx sent

## 2015-01-04 ENCOUNTER — Other Ambulatory Visit: Payer: BC Managed Care – PPO

## 2015-01-07 ENCOUNTER — Ambulatory Visit: Payer: BC Managed Care – PPO | Admitting: Endocrinology

## 2015-01-27 ENCOUNTER — Other Ambulatory Visit: Payer: BC Managed Care – PPO

## 2015-02-01 ENCOUNTER — Ambulatory Visit: Payer: BC Managed Care – PPO | Admitting: Endocrinology

## 2015-02-22 ENCOUNTER — Telehealth: Payer: Self-pay | Admitting: Endocrinology

## 2015-02-22 NOTE — Telephone Encounter (Signed)
Patient would a refill on her medication   Rx: Metformin  Victoza Levemir Victoza   Pharmacy:  [facility] [address]

## 2015-02-23 ENCOUNTER — Other Ambulatory Visit: Payer: Self-pay | Admitting: *Deleted

## 2015-02-23 MED ORDER — METFORMIN HCL 500 MG PO TABS: ORAL_TABLET | ORAL | Status: DC

## 2015-02-23 MED ORDER — LIRAGLUTIDE 18 MG/3ML ~~LOC~~ SOPN: 1.2000 mg | PEN_INJECTOR | Freq: Every day | SUBCUTANEOUS | Status: DC

## 2015-02-23 MED ORDER — INSULIN DETEMIR 100 UNIT/ML FLEXPEN: 22.0000 [IU] | PEN_INJECTOR | Freq: Every day | SUBCUTANEOUS | Status: DC

## 2015-02-23 NOTE — Telephone Encounter (Signed)
See note below, Thanks! 

## 2015-03-01 ENCOUNTER — Other Ambulatory Visit: Payer: Self-pay | Admitting: *Deleted

## 2015-03-01 MED ORDER — LIRAGLUTIDE 18 MG/3ML ~~LOC~~ SOPN: 1.2000 mg | PEN_INJECTOR | Freq: Every day | SUBCUTANEOUS | Status: DC

## 2015-03-30 ENCOUNTER — Other Ambulatory Visit: Payer: BC Managed Care – PPO

## 2015-04-02 ENCOUNTER — Ambulatory Visit: Payer: BC Managed Care – PPO | Admitting: Endocrinology

## 2015-04-05 ENCOUNTER — Other Ambulatory Visit: Payer: Self-pay | Admitting: *Deleted

## 2015-04-05 ENCOUNTER — Telehealth: Payer: Self-pay | Admitting: Endocrinology

## 2015-04-05 MED ORDER — INSULIN DETEMIR 100 UNIT/ML FLEXPEN: 22.0000 [IU] | PEN_INJECTOR | Freq: Every day | SUBCUTANEOUS | Status: DC

## 2015-04-05 MED ORDER — METFORMIN HCL 500 MG PO TABS: ORAL_TABLET | ORAL | Status: DC

## 2015-04-05 NOTE — Telephone Encounter (Signed)
rx sent

## 2015-04-05 NOTE — Telephone Encounter (Signed)
Patient need refill on metformin and levemir

## 2015-04-23 ENCOUNTER — Encounter: Payer: Self-pay | Admitting: Endocrinology

## 2015-04-30 ENCOUNTER — Ambulatory Visit: Payer: BC Managed Care – PPO | Admitting: Endocrinology

## 2015-05-04 ENCOUNTER — Other Ambulatory Visit: Payer: BC Managed Care – PPO

## 2015-05-07 ENCOUNTER — Ambulatory Visit: Payer: BC Managed Care – PPO | Admitting: Endocrinology

## 2015-05-10 ENCOUNTER — Other Ambulatory Visit: Payer: Self-pay | Admitting: *Deleted

## 2015-05-10 MED ORDER — METFORMIN HCL 500 MG PO TABS: ORAL_TABLET | ORAL | Status: DC

## 2015-05-10 MED ORDER — INSULIN PEN NEEDLE 32G X 4 MM MISC: Status: DC

## 2015-05-17 ENCOUNTER — Other Ambulatory Visit (INDEPENDENT_AMBULATORY_CARE_PROVIDER_SITE_OTHER): Payer: BC Managed Care – PPO

## 2015-05-17 DIAGNOSIS — E1165 Type 2 diabetes mellitus with hyperglycemia: Secondary | ICD-10-CM

## 2015-05-17 DIAGNOSIS — IMO0002 Reserved for concepts with insufficient information to code with codable children: Secondary | ICD-10-CM

## 2015-05-17 LAB — URINALYSIS, ROUTINE W REFLEX MICROSCOPIC
Bilirubin Urine: NEGATIVE
Hgb urine dipstick: NEGATIVE
LEUKOCYTES UA: NEGATIVE
Nitrite: NEGATIVE
Specific Gravity, Urine: >=1.03 — AB (ref 1.000–1.030)
Urine Glucose: NEGATIVE
Urobilinogen, UA: 0.2 (ref 0.0–1.0)
pH: 6 (ref 5.0–8.0)

## 2015-05-17 LAB — BASIC METABOLIC PANEL
BUN: 17 mg/dL (ref 6–23)
CO2: 28 mEq/L (ref 19–32)
Calcium: 9.3 mg/dL (ref 8.4–10.5)
Chloride: 101 mEq/L (ref 96–112)
Creatinine, Ser: 0.68 mg/dL (ref 0.40–1.20)
GFR: 114.05 mL/min (ref >60.00)
Glucose, Bld: 131 mg/dL — ABNORMAL HIGH (ref 70–99)
POTASSIUM: 4.2 meq/L (ref 3.5–5.1)
SODIUM: 135 meq/L (ref 135–145)

## 2015-05-17 LAB — HEMOGLOBIN A1C: Hgb A1c MFr Bld: 6.7 % — ABNORMAL HIGH (ref 4.6–6.5)

## 2015-05-19 ENCOUNTER — Ambulatory Visit: Payer: BC Managed Care – PPO | Admitting: Endocrinology

## 2015-05-20 ENCOUNTER — Other Ambulatory Visit: Payer: Self-pay

## 2015-05-20 ENCOUNTER — Encounter: Payer: Self-pay | Admitting: Endocrinology

## 2015-05-20 ENCOUNTER — Ambulatory Visit (INDEPENDENT_AMBULATORY_CARE_PROVIDER_SITE_OTHER): Payer: BC Managed Care – PPO | Admitting: Endocrinology

## 2015-05-20 VITALS — BP 120/74 | HR 83 | Temp 98.7°F | Resp 16 | Wt 197.2 lb

## 2015-05-20 DIAGNOSIS — IMO0002 Reserved for concepts with insufficient information to code with codable children: Secondary | ICD-10-CM

## 2015-05-20 DIAGNOSIS — E785 Hyperlipidemia, unspecified: Secondary | ICD-10-CM | POA: Diagnosis not present

## 2015-05-20 DIAGNOSIS — E1165 Type 2 diabetes mellitus with hyperglycemia: Secondary | ICD-10-CM

## 2015-05-20 MED ORDER — LIRAGLUTIDE 18 MG/3ML ~~LOC~~ SOPN: 1.8000 mg | PEN_INJECTOR | Freq: Every day | SUBCUTANEOUS | Status: DC

## 2015-05-20 NOTE — Progress Notes (Signed)
Patient ID: Kathleen Reid, female   DOB: 18-Oct-1956, 59 y.o.   MRN: [id number]    Reason for Appointment: Diabetes follow-up   History of Present Illness   Diagnosis: Type 2 DIABETES MELITUS,  long-standing  Previous history: She has been on a variety of different regimens for diabetes management over the last few years and has had difficulty getting consistent controlled. She was started on Victoza in 8/14 when her A1c was 8% and had improvement in her blood sugars  RECENT history:   Insulin regimen: Levemir 0-22 units daily, Novolog 16 units before meals, usually compliant with this             She has been noncompliant with her follow-up again and has not been seen since 12/15 She came in because she could not afford the Levemir insulin as the co-pay had gone up She has not taken any for the last 2 weeks Also did not bring her monitor for download today  Current blood sugar patterns, management and problems identified:  On her last visit she was told to take her Victoza 1.2 mg daily consistently, previously had been irregular with that especially after an episode of gastroenteritis  She has started doing very regular exercise program almost daily up to one hour and with this she thinks her blood sugars started getting lower after her exercise in the morning; she reduced her Novolog from 22 down to 16 units  She has gained a little weight  FASTING blood sugars reportedly are near-normal even without the Levemir in the last 2 weeks  She also thinks that her blood sugars after her evening meal are also fairly well controlled and only once was about 170  She has been more motivated to watch her diet and is watching her portions and carbohydrates better  Oral hypoglycemic drugs: Metformin 1500 mg daily.        Side effects from medications: None  Proper timing of medications in relation to meals: Yes.          Monitors blood glucose:  irregularly     Glucometer: Accu-Chek            Blood Glucose readings from recall   PRE-MEAL Fasting Lunch Dinner Bedtime Overall  Glucose range: 84-114 130 140 130-140   Mean/median:         Hypoglycemia frequency: none           Meals: 3 meals per day.  Generally not eating breakfast  Physical activity: exercise: walking and Gym 7/7              Wt Readings from Last 3 Encounters:  05/20/15 197 lb 3.2 oz (89.449 kg)  11/05/14 193 lb 12.8 oz (87.907 kg)  12/24/13 194 lb 1.6 oz (88.043 kg)   Lab Results  Component Value Date   HGBA1C 6.7* 05/17/2015   HGBA1C 7.8* 10/08/2014   HGBA1C 8.0* 07/23/2013   Lab Results  Component Value Date   MICROALBUR 0.4 07/23/2013   CREATININE 0.68 05/17/2015    Lab on 05/17/2015  Component Date Value Ref Range Status  . Hgb A1c MFr Bld 05/17/2015 6.7* 4.6 - 6.5 % Final   Glycemic Control Guidelines for People with Diabetes:Non Diabetic:  <6%Goal of Therapy: <7%Additional Action Suggested:  >8%   . Sodium 05/17/2015 135  135 - 145 mEq/L Final  . Potassium 05/17/2015 4.2  3.5 - 5.1 mEq/L Final  . Chloride 05/17/2015 101  96 - 112 mEq/L Final  . CO2  05/17/2015 28  19 - 32 mEq/L Final  . Glucose, Bld 05/17/2015 131* 70 - 99 mg/dL Final  . BUN 16/09/9603 17  6 - 23 mg/dL Final  . Creatinine, Ser 05/17/2015 0.68  0.40 - 1.20 mg/dL Final  . Calcium 54/08/8118 9.3  8.4 - 10.5 mg/dL Final  . GFR 14/78/2956 114.05  >60.00 mL/min Final  . Color, Urine 05/17/2015 YELLOW  Yellow;Lt. Yellow Final  . APPearance 05/17/2015 CLEAR  Clear Final  . Specific Gravity, Urine 05/17/2015 >=1.030* 1.000 - 1.030 Final  . pH 05/17/2015 6.0  5.0 - 8.0 Final  . Total Protein, Urine 05/17/2015 TRACE* Negative Final  . Urine Glucose 05/17/2015 NEGATIVE  Negative Final  . Ketones, ur 05/17/2015 TRACE* Negative Final  . Bilirubin Urine 05/17/2015 NEGATIVE  Negative Final  . Hgb urine dipstick 05/17/2015 NEGATIVE  Negative Final  . Urobilinogen, UA 05/17/2015 0.2  0.0 - 1.0 Final  . Leukocytes, UA  05/17/2015 NEGATIVE  Negative Final  . Nitrite 05/17/2015 NEGATIVE  Negative Final  . WBC, UA 05/17/2015 0-2/hpf  0-2/hpf Final  . RBC / HPF 05/17/2015 0-2/hpf  0-2/hpf Final  . Mucus, UA 05/17/2015 Presence of* None Final  . Squamous Epithelial / LPF 05/17/2015 Few(5-10/hpf)* Rare(0-4/hpf) Final  . Bacteria, UA 05/17/2015 Rare(<10/hpf)* None Final      Medication List       This list is accurate as of: 05/20/15 11:59 PM.  Always use your most recent med list.               glucose blood test strip  Commonly known as:  ACCU-CHEK SMARTVIEW  Use as instructed to check blood sugar 3 times per day dx code E11.9     insulin aspart 100 UNIT/ML FlexPen  Commonly known as:  NOVOLOG  Inject 22 Units into the skin 3 (three) times daily with meals.     Insulin Pen Needle 32G X 4 MM Misc  Commonly known as:  BD PEN NEEDLE NANO U/F  Use one per day     Liraglutide 18 MG/3ML Sopn  Commonly known as:  VICTOZA  Inject 0.3 mLs (1.8 mg total) into the skin daily.     metFORMIN 500 MG tablet  Commonly known as:  GLUCOPHAGE  Take one tablet in the morning and 2 tablets in the evening.     Vitamin D (Ergocalciferol) 50000 UNITS Caps capsule  Commonly known as:  DRISDOL        Allergies:  Allergies  Allergen Reactions  . Codeine     hallucinations    Past Medical History  Diagnosis Date  . Diabetes mellitus without complication     No past surgical history on file.  Family History  Problem Relation Age of Onset  . Diabetes Mother   . Heart disease Mother   . Diabetes Father   . Heart disease Father   . Diabetes Sister   . Heart disease Sister     Social History:  reports that she has never smoked. She has never used smokeless tobacco. Her alcohol and drug histories are not on file.  Review of Systems:  No history of hypertension    She has not been on lipid-lowering medications and no previous records available  No results found for: CHOL, HDL, LDLCALC,  LDLDIRECT, TRIG, CHOLHDL    she has been treated for vitamin D deficiency    Examination:   BP 120/74 mmHg  Pulse 83  Temp(Src) 98.7 F (37.1 C) (Oral)  Resp 16  Wt 197 lb 3.2 oz (89.449 kg)  SpO2 96%  Body mass index is 31.84 kg/(m^2).   ASSESSMENT/ PLAN:   Diabetes type 2   The patient's diabetes control appears to be significantly improved with taking 1.2 mg Victoza regularly and also changing her lifestyle significantly especially with the vigorous exercise regimen daily A1c is down to 6.7 which is much better than usual Surprisingly her fasting blood sugars are fairly good at home without taking any basal insulin recently; her morning glucose in the lab was nonfasting Currently she is very motivated to work on her diet and exercise and she wants to try and reduce her medications and injectable drugs  Recommendations:  She will increase her metformin to maximum dose of 2000 mg  Increase Victoza to 1.8 mg  May be able to taper down her mealtime insulin if blood sugars improve further after meals, discussed blood sugar targets   Bring blood sugar records on the next visit for review  Need lipid panel on her next visit  Patient Instructions  Metformin 2 pills twice daily  Check blood sugars on waking up .. 3 .. times a week Also check blood sugars about 2 hours after a meal and do this after different meals by rotation  Recommended blood sugar levels on waking up is 90-130 and about 2 hours after meal is 140-180 Please bring blood sugar monitor to each visit.      Bobie Kistler 05/21/2015, 9:40 AM

## 2015-05-20 NOTE — Patient Instructions (Signed)
Metformin 2 pills twice daily  Check blood sugars on waking up .. 3 .. times a week Also check blood sugars about 2 hours after a meal and do this after different meals by rotation  Recommended blood sugar levels on waking up is 90-130 and about 2 hours after meal is 140-180 Please bring blood sugar monitor to each visit.

## 2015-05-21 LAB — MICROALBUMIN / CREATININE URINE RATIO
CREATININE, U: 197.8 mg/dL
MICROALB/CREAT RATIO: 0.4 mg/g (ref 0.0–30.0)
Microalb, Ur: 0.8 mg/dL (ref 0.0–1.9)

## 2015-06-24 ENCOUNTER — Other Ambulatory Visit: Payer: Self-pay | Admitting: *Deleted

## 2015-06-24 MED ORDER — METFORMIN HCL 500 MG PO TABS: ORAL_TABLET | ORAL | Status: DC

## 2015-08-17 ENCOUNTER — Other Ambulatory Visit: Payer: BC Managed Care – PPO

## 2015-08-20 ENCOUNTER — Encounter: Payer: Self-pay | Admitting: *Deleted

## 2015-08-20 ENCOUNTER — Ambulatory Visit: Payer: BC Managed Care – PPO | Admitting: Endocrinology

## 2015-08-20 ENCOUNTER — Telehealth: Payer: Self-pay | Admitting: Endocrinology

## 2015-08-20 NOTE — Telephone Encounter (Signed)
Patient no showed today's appt. Please advise on how to follow up. °A. No follow up necessary. °B. Follow up urgent. Contact patient immediately. °C. Follow up necessary. Contact patient and schedule visit in ___ days. °D. Follow up advised. Contact patient and schedule visit in ____weeks. ° °

## 2015-08-20 NOTE — Telephone Encounter (Signed)
Letter mailed

## 2015-08-30 NOTE — Telephone Encounter (Signed)
FYI  Please see below

## 2015-08-30 NOTE — Telephone Encounter (Signed)
Pt just lost her mother she will be in [place] helping sort out her things until December, she will make another appt after she returns

## 2015-09-14 ENCOUNTER — Other Ambulatory Visit: Payer: Self-pay | Admitting: Endocrinology

## 2015-12-27 ENCOUNTER — Other Ambulatory Visit: Payer: Self-pay | Admitting: Endocrinology

## 2016-01-25 ENCOUNTER — Other Ambulatory Visit: Payer: Self-pay | Admitting: Endocrinology

## 2016-02-04 ENCOUNTER — Other Ambulatory Visit: Payer: Self-pay | Admitting: *Deleted

## 2016-02-04 ENCOUNTER — Telehealth: Payer: Self-pay | Admitting: Endocrinology

## 2016-02-04 MED ORDER — INSULIN PEN NEEDLE 32G X 4 MM MISC: Status: DC

## 2016-02-04 NOTE — Telephone Encounter (Signed)
Pt said she has enough medication to last until her appointment but she does not have enough needles to last.  [facility] [address]

## 2016-02-04 NOTE — Telephone Encounter (Signed)
rx sent for a 30 day supply only 

## 2016-02-15 ENCOUNTER — Other Ambulatory Visit: Payer: BC Managed Care – PPO

## 2016-02-21 ENCOUNTER — Ambulatory Visit: Payer: BC Managed Care – PPO | Admitting: Endocrinology

## 2016-02-25 ENCOUNTER — Other Ambulatory Visit (INDEPENDENT_AMBULATORY_CARE_PROVIDER_SITE_OTHER): Payer: BC Managed Care – PPO

## 2016-02-25 DIAGNOSIS — E1165 Type 2 diabetes mellitus with hyperglycemia: Secondary | ICD-10-CM

## 2016-02-25 DIAGNOSIS — IMO0002 Reserved for concepts with insufficient information to code with codable children: Secondary | ICD-10-CM

## 2016-02-25 LAB — BASIC METABOLIC PANEL
BUN: 12 mg/dL (ref 6–23)
CO2: 28 mEq/L (ref 19–32)
Calcium: 9.1 mg/dL (ref 8.4–10.5)
Chloride: 100 mEq/L (ref 96–112)
Creatinine, Ser: 0.72 mg/dL (ref 0.40–1.20)
GFR: 106.48 mL/min (ref >60.00)
Glucose, Bld: 217 mg/dL — ABNORMAL HIGH (ref 70–99)
POTASSIUM: 3.9 meq/L (ref 3.5–5.1)
Sodium: 134 mEq/L — ABNORMAL LOW (ref 135–145)

## 2016-02-25 LAB — HEMOGLOBIN A1C: HEMOGLOBIN A1C: 9.8 % — AB (ref 4.6–6.5)

## 2016-03-06 ENCOUNTER — Encounter: Payer: Self-pay | Admitting: Endocrinology

## 2016-03-06 ENCOUNTER — Ambulatory Visit (INDEPENDENT_AMBULATORY_CARE_PROVIDER_SITE_OTHER): Payer: BC Managed Care – PPO | Admitting: Endocrinology

## 2016-03-06 VITALS — BP 126/74 | HR 84 | Temp 97.9°F | Resp 16 | Ht 68.0 in | Wt 202.4 lb

## 2016-03-06 DIAGNOSIS — E785 Hyperlipidemia, unspecified: Secondary | ICD-10-CM

## 2016-03-06 DIAGNOSIS — Z794 Long term (current) use of insulin: Secondary | ICD-10-CM | POA: Diagnosis not present

## 2016-03-06 DIAGNOSIS — E1165 Type 2 diabetes mellitus with hyperglycemia: Secondary | ICD-10-CM

## 2016-03-06 NOTE — Progress Notes (Signed)
Patient ID: Kathleen Reid, female   DOB: [birth year], 60 y.o.   MRN: [id number]    Reason for Appointment: Diabetes follow-up   History of Present Illness   Diagnosis: Type 2 DIABETES MELITUS,  long-standing  Previous history: Kathleen Reid has been on a variety of different regimens for diabetes management over the last few years and has had difficulty getting consistent controlled. Kathleen Reid was started on Victoza in 8/14 when her A1c was 8% and had improvement in her blood sugars  RECENT history:   Insulin regimen: Currently no Levemir, Novolog 22 units before meals           Kathleen Reid has been noncompliant with her follow-up again and has not been seen since 05/2015 Her A1c is much higher at 9.8  Current blood sugar patterns, management and problems identified:  On her last visit Kathleen Reid was told to take her Victoza 1.8 mg but Kathleen Reid is still taking 1.2  Also Kathleen Reid was told to increase metformin to 2000 mg, taking only 1500  Kathleen Reid said that her blood sugars are poorly controlled because of family less than eating out as well as not paying attention to her diabetes or diet  Has only started exercising in the last 2-3 weeks   Although Kathleen Reid was previously taking 16 of Novolog Kathleen Reid is arbitrarily taking 22 units before each meal without any consideration for the meal size or pre-meal blood sugar  Glucose monitoring has been irregular and mostly in the morning  Kathleen Reid has gained a little weight again  Her monitor has incorrect date programmed  FASTING blood sugars mildly increased although not consistent, also occasionally checking blood sugars during the night which are not high  Kathleen Reid has a few readings only midday and afternoon which are fairly good  Blood sugars are relatively lower after supper including several low readings  Oral hypoglycemic drugs: Metformin 1500 mg daily.        Side effects from medications: None  Proper timing of medications in relation to meals: Yes.          Monitors blood  glucose:      Glucometer: Accu-Chek           Blood Glucose readings from download:  Mean values apply above for all meters except median for One Touch  PRE-MEAL Fasting Lunch Dinner Bedtime Overall  Glucose range: 76-186  123-180  121  46-268    Mean/median: 140     121             Meals: 3 meals per day. Breakfast 7 am, 12 noon, 5:30  frequently not eating breakfast  Physical activity: exercise: walking and Gym 3/7, starting recently              Wt Readings from Last 3 Encounters:  03/06/16 202 lb 6.4 oz (91.808 kg)  05/20/15 197 lb 3.2 oz (89.449 kg)  11/05/14 193 lb 12.8 oz (87.907 kg)   Lab Results  Component Value Date   HGBA1C 9.8* 02/25/2016   HGBA1C 6.7* 05/17/2015   HGBA1C 7.8* 10/08/2014   Lab Results  Component Value Date   MICROALBUR 0.8 05/20/2015   CREATININE 0.72 02/25/2016    No visits with results within 1 Week(s) from this visit. Latest known visit with results is:  Lab on 02/25/2016  Component Date Value Ref Range Status  . Hgb A1c MFr Bld 02/25/2016 9.8* 4.6 - 6.5 % Final   Glycemic Control Guidelines for People with Diabetes:Non Diabetic:  <6%Goal of Therapy: <  7%Additional Action Suggested:  >8%   . Sodium 02/25/2016 134* 135 - 145 mEq/L Final  . Potassium 02/25/2016 3.9  3.5 - 5.1 mEq/L Final  . Chloride 02/25/2016 100  96 - 112 mEq/L Final  . CO2 02/25/2016 28  19 - 32 mEq/L Final  . Glucose, Bld 02/25/2016 217* 70 - 99 mg/dL Final  . BUN 40/98/119103/24/2017 12  6 - 23 mg/dL Final  . Creatinine, Ser 02/25/2016 0.72  0.40 - 1.20 mg/dL Final  . Calcium 47/82/956203/24/2017 9.1  8.4 - 10.5 mg/dL Final  . GFR 13/08/657803/24/2017 106.48  >60.00 mL/min Final      Medication List       This list is accurate as of: 03/06/16 11:59 PM.  Always use your most recent med list.               glucose blood test strip  Commonly known as:  ACCU-CHEK SMARTVIEW  Use as instructed to check blood sugar 3 times per day dx code E11.9     Insulin Pen Needle 32G X 4 MM Misc  Commonly  known as:  BD PEN NEEDLE NANO U/F  Use one per day     Liraglutide 18 MG/3ML Sopn  Commonly known as:  VICTOZA  Inject 0.3 mLs (1.8 mg total) into the skin daily.     metFORMIN 500 MG tablet  Commonly known as:  GLUCOPHAGE  Take one tablet in the morning and 2 tablets in the evening.     NOVOLOG FLEXPEN 100 UNIT/ML FlexPen  Generic drug:  insulin aspart  inject 22 units subcutaneously three times a day with meals     Vitamin D (Ergocalciferol) 50000 units Caps capsule  Commonly known as:  DRISDOL        Allergies:  Allergies  Allergen Reactions  . Codeine     hallucinations    Past Medical History  Diagnosis Date  . Diabetes mellitus without complication (HCC)     No past surgical history on file.  Family History  Problem Relation Age of Onset  . Diabetes Mother   . Heart disease Mother   . Diabetes Father   . Heart disease Father   . Diabetes Sister   . Heart disease Sister     Social History:  reports that Kathleen Reid has never smoked. Kathleen Reid has never used smokeless tobacco. Her alcohol and drug histories are not on file.  Review of Systems:    Kathleen Reid has not been on lipid-lowering medications Recently, previously had been on Lipitor Recent lipid panel from PCP shows LDL 134  No results found for: CHOL, HDL, LDLCALC, LDLDIRECT, TRIG, CHOLHDL    Kathleen Reid has been treated for vitamin D deficiency    Examination:   BP 126/74 mmHg  Pulse 84  Temp(Src) 97.9 F (36.6 C)  Resp 16  Ht 5\' 8"  (1.727 m)  Wt 202 lb 6.4 oz (91.808 kg)  BMI 30.78 kg/m2  SpO2 96%  Body mass index is 30.78 kg/(m^2).   ASSESSMENT/ PLAN:   Diabetes type 2 on insulin See history of present illness for detailed discussion of current diabetes management, blood sugar patterns and problems identified  The patient's diabetes control appears to be significantly worse which is appearing to be from noncompliance with her diet and exercise regimen and likely some regularity with her insulin  regimen However more recently with her family stress situation resolving and her getting back on diet and exercise her blood sugars appear to be improving significantly Does have a  few high fasting readings but not consistently Kathleen Reid does appear to be getting excessive amounts of Novolog at suppertime and Kathleen Reid is getting frequent hypoglycemia with sugars as low as 46  Recommendations:  Kathleen Reid will change her metformin to 1 in the morning and 2 at suppertime  Reduce Novolog 16 at suppertime  Adjust Novolog based on meal size and carbohydrate intake  More blood sugars after breakfast and lunch  No change in Victoza as yet  Kathleen Reid will discuss her lipid management with PCP, does need to be on statin drugs, previously on Lipitor  Patient Instructions  Novolog 16 at supper  Metformin 1 in am and 2 supper  Check blood sugars on waking up 3  times a week Also check blood sugars about 2 hours after a meal and do this after different meals by rotation  Recommended blood sugar levels on waking up is 90-130 and about 2 hours after meal is 130-160  Please bring your blood sugar monitor to each visit, thank you     Counseling time on subjects discussed above is over 50% of today's 25 minute visit    Mckenlee Mangham 03/07/2016, 12:47 PM

## 2016-03-06 NOTE — Patient Instructions (Signed)
Novolog 16 at supper  Metformin 1 in am and 2 supper  Check blood sugars on waking up 3  times a week Also check blood sugars about 2 hours after a meal and do this after different meals by rotation  Recommended blood sugar levels on waking up is 90-130 and about 2 hours after meal is 130-160  Please bring your blood sugar monitor to each visit, thank you

## 2016-03-15 ENCOUNTER — Other Ambulatory Visit: Payer: Self-pay | Admitting: Endocrinology

## 2016-03-15 MED ORDER — INSULIN ASPART 100 UNIT/ML FLEXPEN: PEN_INJECTOR | SUBCUTANEOUS | Status: DC

## 2016-03-15 MED ORDER — INSULIN PEN NEEDLE 32G X 4 MM MISC: Status: AC

## 2016-03-15 NOTE — Telephone Encounter (Signed)
Pt requests refills of Novolog Metformin and [facility] [address]

## 2016-04-10 ENCOUNTER — Other Ambulatory Visit: Payer: Self-pay | Admitting: Endocrinology

## 2016-04-28 ENCOUNTER — Other Ambulatory Visit: Payer: BC Managed Care – PPO

## 2016-04-28 ENCOUNTER — Telehealth: Payer: Self-pay | Admitting: Endocrinology

## 2016-04-28 NOTE — Telephone Encounter (Signed)
PT called back, said they need to be mailed to  [facility]  [address] [place], [place] [id number]

## 2016-04-28 NOTE — Telephone Encounter (Signed)
Forms are on your desk.

## 2016-04-28 NOTE — Telephone Encounter (Signed)
Patient ask if you could [facility] to department of transportation medical unit review, she stated that the information was in the packet.

## 2016-05-02 NOTE — Telephone Encounter (Signed)
PT called and said that the [facility] informed her the letter from us was not necessary, if the forms have already been sent it is okay, but if they have not they may be shredded.

## 2016-05-02 NOTE — Telephone Encounter (Signed)
FYI  Please see below

## 2016-05-03 ENCOUNTER — Ambulatory Visit: Payer: BC Managed Care – PPO | Admitting: Endocrinology

## 2016-07-28 ENCOUNTER — Other Ambulatory Visit: Payer: BC Managed Care – PPO

## 2016-08-02 ENCOUNTER — Ambulatory Visit: Payer: BC Managed Care – PPO | Admitting: Endocrinology

## 2016-08-23 ENCOUNTER — Other Ambulatory Visit: Payer: Self-pay | Admitting: Endocrinology

## 2016-08-24 ENCOUNTER — Telehealth: Payer: Self-pay | Admitting: Endocrinology

## 2016-08-24 ENCOUNTER — Other Ambulatory Visit: Payer: Self-pay | Admitting: *Deleted

## 2016-08-24 MED ORDER — METFORMIN HCL 500 MG PO TABS: ORAL_TABLET | ORAL | 0 refills | Status: DC

## 2016-08-24 NOTE — Telephone Encounter (Signed)
[facility] [address]

## 2016-08-24 NOTE — Telephone Encounter (Signed)
Rx sent for a 30 day supply only, patient has to be seen to get any other refills.

## 2016-09-04 ENCOUNTER — Other Ambulatory Visit (INDEPENDENT_AMBULATORY_CARE_PROVIDER_SITE_OTHER): Payer: BC Managed Care – PPO

## 2016-09-04 DIAGNOSIS — Z794 Long term (current) use of insulin: Secondary | ICD-10-CM | POA: Diagnosis not present

## 2016-09-04 DIAGNOSIS — E1165 Type 2 diabetes mellitus with hyperglycemia: Secondary | ICD-10-CM | POA: Diagnosis not present

## 2016-09-04 LAB — COMPREHENSIVE METABOLIC PANEL
ALK PHOS: 83 U/L (ref 39–117)
ALT: 22 U/L (ref 0–35)
AST: 16 U/L (ref 0–37)
Albumin: 4.2 g/dL (ref 3.5–5.2)
BUN: 11 mg/dL (ref 6–23)
CALCIUM: 9.3 mg/dL (ref 8.4–10.5)
CHLORIDE: 96 meq/L (ref 96–112)
CO2: 28 mEq/L (ref 19–32)
Creatinine, Ser: 0.67 mg/dL (ref 0.40–1.20)
GFR: 115.5 mL/min (ref >60.00)
Glucose, Bld: 300 mg/dL — ABNORMAL HIGH (ref 70–99)
POTASSIUM: 4.2 meq/L (ref 3.5–5.1)
Sodium: 132 mEq/L — ABNORMAL LOW (ref 135–145)
TOTAL PROTEIN: 7.6 g/dL (ref 6.0–8.3)
Total Bilirubin: 0.3 mg/dL (ref 0.2–1.2)

## 2016-09-04 LAB — MICROALBUMIN / CREATININE URINE RATIO
CREATININE, U: 107.3 mg/dL
MICROALB UR: 1.3 mg/dL (ref 0.0–1.9)
Microalb Creat Ratio: 1.2 mg/g (ref 0.0–30.0)

## 2016-09-05 LAB — FRUCTOSAMINE: Fructosamine: 433 umol/L — ABNORMAL HIGH (ref 0–285)

## 2016-09-07 ENCOUNTER — Ambulatory Visit: Payer: BC Managed Care – PPO | Admitting: Endocrinology

## 2016-09-11 ENCOUNTER — Encounter: Payer: Self-pay | Admitting: Endocrinology

## 2016-09-11 ENCOUNTER — Ambulatory Visit (INDEPENDENT_AMBULATORY_CARE_PROVIDER_SITE_OTHER): Payer: BC Managed Care – PPO | Admitting: Endocrinology

## 2016-09-11 VITALS — BP 134/76 | HR 94 | Temp 97.9°F | Resp 16 | Ht 68.0 in | Wt 191.0 lb

## 2016-09-11 DIAGNOSIS — E1165 Type 2 diabetes mellitus with hyperglycemia: Secondary | ICD-10-CM

## 2016-09-11 DIAGNOSIS — Z794 Long term (current) use of insulin: Secondary | ICD-10-CM

## 2016-09-11 DIAGNOSIS — E119 Type 2 diabetes mellitus without complications: Secondary | ICD-10-CM

## 2016-09-11 LAB — POCT GLUCOSE (DEVICE FOR HOME USE): Glucose Fasting, POC: 447 mg/dL — AB (ref 70–99)

## 2016-09-11 LAB — POCT GLYCOSYLATED HEMOGLOBIN (HGB A1C): Hemoglobin A1C: 10.7

## 2016-09-11 MED ORDER — INSULIN DEGLUDEC 100 UNIT/ML ~~LOC~~ SOPN: 20.0000 [IU] | PEN_INJECTOR | Freq: Every day | SUBCUTANEOUS | 1 refills | Status: DC

## 2016-09-11 NOTE — Progress Notes (Signed)
Patient ID: Kathleen Reid, female   DOB: 11-24-1956, 60 y.o.   MRN: [id number]    Reason for Appointment: Diabetes follow-up   History of Present Illness   Diagnosis: Type 2 DIABETES MELITUS,  long-standing  Previous history: She has been on a variety of different regimens for diabetes management over the last few years and has had difficulty getting consistent controlled. She was started on Victoza in 8/14 when her A1c was 8% and had improvement in her blood sugars  RECENT history:   Insulin regimen:  Novolog 10 units before meals, twice a day           She has been noncompliant with her follow-up again and has not been seen for 6 months Her A1c is much higher at 10.7, previously 9.8   Current blood sugar patterns, management and problems identified:  On her last visit she was told to take her Victoza 1.8 mg but she is now taking only 0.6.  She thinks it makes her cramp in her muscles.  She has checked her blood sugars very sporadically and currently does not have a functioning meter.  LAB glucose was 300 fasting  She was having some tingling sensation in her left cheek area and she thought it was from NovoLog and she cut the dose down more than half on her own.  She has lost a significant amount of weight without trying  She does take her metformin  Oral hypoglycemic drugs: Metformin 1500 mg daily.        Side effects from medications: None  Proper timing of medications in relation to meals: Yes.          Monitors blood glucose:      Glucometer: Accu-Chek           Blood Glucose readings from recall: Range: 130-287           Meals: 3 meals per day. Breakfast 7 am, 12 noon, 5:30  frequently not eating breakfast  Physical activity: exercise: walking and Gym 3/7           Wt Readings from Last 3 Encounters:  09/11/16 191 lb (86.6 kg)  03/06/16 202 lb 6.4 oz (91.8 kg)  05/20/15 197 lb 3.2 oz (89.4 kg)   Lab Results  Component Value Date   HGBA1C 10.7 09/11/2016   HGBA1C 9.8 (H) 02/25/2016   HGBA1C 6.7 (H) 05/17/2015   Lab Results  Component Value Date   MICROALBUR 1.3 09/04/2016   CREATININE 0.67 09/04/2016    Office Visit on 09/11/2016  Component Date Value Ref Range Status  . Hemoglobin A1C 09/11/2016 10.7   Final  . Glucose Fasting, POC 09/11/2016 447* 70 - 99 mg/dL Final      Medication List       Accurate as of 09/11/16 11:59 PM. Always use your most recent med list.          ACCU-CHEK SMARTVIEW test strip Generic drug:  glucose blood TEST three times a day   insulin aspart 100 UNIT/ML FlexPen Commonly known as:  NOVOLOG FLEXPEN inject 22 units subcutaneously three times a day with meals   insulin degludec 100 UNIT/ML Sopn FlexTouch Pen Commonly known as:  TRESIBA FLEXTOUCH Inject 0.2 mLs (20 Units total) into the skin daily.   Insulin Pen Needle 32G X 4 MM Misc Commonly known as:  BD PEN NEEDLE NANO U/F Use one per day   liraglutide 18 MG/3ML Sopn Commonly known as:  VICTOZA Inject 0.3 mLs (1.8 mg  total) into the skin daily.   metFORMIN 500 MG tablet Commonly known as:  GLUCOPHAGE TAKE 1 TABLET BY MOUTH 3 TIMES DAILY WITH MORNING, NOON AND EVENING MEAL   Vitamin D (Ergocalciferol) 50000 units Caps capsule Commonly known as:  DRISDOL       Allergies:  Allergies  Allergen Reactions  . Codeine     hallucinations    Past Medical History:  Diagnosis Date  . Diabetes mellitus without complication (HCC)     No past surgical history on file.  Family History  Problem Relation Age of Onset  . Diabetes Mother   . Heart disease Mother   . Diabetes Father   . Heart disease Father   . Diabetes Sister   . Heart disease Sister     Social History:  reports that she has never smoked. She has never used smokeless tobacco. Her alcohol and drug histories are not on file.  Review of Systems:    She has not been on lipid-lowering medications Recently, previously had been on Lipitor Recent lipid panel from PCP  shows LDL 134  No results found for: CHOL, HDL, LDLCALC, LDLDIRECT, TRIG, CHOLHDL    She has been treated for vitamin D deficiency    Examination:   BP 134/76   Pulse 94   Temp 97.9 F (36.6 C)   Resp 16   Ht 5\' 8"  (1.727 m)   Wt 191 lb (86.6 kg)   SpO2 97%   BMI 29.04 kg/m   Body mass index is 29.04 kg/m.   ASSESSMENT/ PLAN:   Diabetes type 2 on insulin See history of present illness for detailed discussion of current diabetes management, blood sugar patterns and problems identified  The patient's diabetes control appears to be significantly worse which is appearing to be from noncompliance with her diet and exercise regimen and likely some regularity with her insulin regimen However more recently with her family stress situation resolving and her getting back on diet and exercise her blood sugars appear to be improving significantly Does have a few high fasting readings but not consistently She does appear to be getting excessive amounts of Novolog at suppertime and she is getting frequent hypoglycemia with sugars as low as 46  Recommendations:  She will change her metformin to 1 in the morning and 2 at suppertime  Reduce Novolog 16 at suppertime  Adjust Novolog based on meal size and carbohydrate intake  More blood sugars after breakfast and lunch  No change in Victoza as yet  She will discuss her lipid management with PCP, does need to be on statin drugs, previously on Lipitor  Patient Instructions  Check blood sugars on waking up daily   Also check blood sugars about 2 hours after a meal and do this after different meals by rotation  Recommended blood sugar levels on waking up is 90-130 and about 2 hours after meal is 130-160  Please bring your blood sugar monitor to each visit, thank you  1.2 Victoza  Novolog 16 units before meals  Metformin 1 at lunch and 2 at dinner    Counseling time on subjects discussed above is over 50% of today's 25 minute  visit    Kathleen Reid 09/12/2016, 7:56 AM   Patient ID: Kathleen Reid, female   DOB: 03/28/1956, 60 y.o.   MRN: [id number]    Reason for Appointment: Diabetes follow-up   History of Present Illness   Diagnosis: Type 2 DIABETES MELITUS,  long-standing  Previous history: She has  been on a variety of different regimens for diabetes management over the last few years and has had difficulty getting consistent controlled. She was started on Victoza in 8/14 when her A1c was 8% and had improvement in her blood sugars  RECENT history:   Insulin regimen: Currently no Levemir, Novolog 22 units before meals           She has been noncompliant with her follow-up again and has not been seen since 05/2015 Her A1c is much higher at 9.8  Current blood sugar patterns, management and problems identified:  On her last visit she was told to take her Victoza 1.8 mg but she is still taking 1.2  Also she was told to increase metformin to 2000 mg, taking only 1500  She said that her blood sugars are poorly controlled because of family less than eating out as well as not paying attention to her diabetes or diet  Has only started exercising in the last 2-3 weeks   Although she was previously taking 16 of Novolog she is arbitrarily taking 22 units before each meal without any consideration for the meal size or pre-meal blood sugar  Glucose monitoring has been irregular and mostly in the morning  She has gained a little weight again  Her monitor has incorrect date programmed  FASTING blood sugars mildly increased although not consistent, also occasionally checking blood sugars during the night which are not high  She has a few readings only midday and afternoon which are fairly good  Blood sugars are relatively lower after supper including several low readings  Oral hypoglycemic drugs: Metformin 1500 mg daily.        Side effects from medications: None  Proper timing of medications in  relation to meals: Yes.          Monitors blood glucose:      Glucometer: Accu-Chek           Blood Glucose readings from download:  Mean values apply above for all meters except median for One Touch  PRE-MEAL Fasting Lunch Dinner Bedtime Overall  Glucose range: 76-186  123-180  121  46-268    Mean/median: 140     121             Meals: 3 meals per day. Breakfast 7 am, 12 noon, 5:30  frequently not eating breakfast  Physical activity: exercise: walking and Gym 3/7, starting recently              Wt Readings from Last 3 Encounters:  09/11/16 191 lb (86.6 kg)  03/06/16 202 lb 6.4 oz (91.8 kg)  05/20/15 197 lb 3.2 oz (89.4 kg)   Lab Results  Component Value Date   HGBA1C 10.7 09/11/2016   HGBA1C 9.8 (H) 02/25/2016   HGBA1C 6.7 (H) 05/17/2015   Lab Results  Component Value Date   MICROALBUR 1.3 09/04/2016   CREATININE 0.67 09/04/2016    Office Visit on 09/11/2016  Component Date Value Ref Range Status  . Hemoglobin A1C 09/11/2016 10.7   Final  . Glucose Fasting, POC 09/11/2016 447* 70 - 99 mg/dL Final      Medication List       Accurate as of 09/11/16 11:59 PM. Always use your most recent med list.          ACCU-CHEK SMARTVIEW test strip Generic drug:  glucose blood TEST three times a day   insulin aspart 100 UNIT/ML FlexPen Commonly known as:  NOVOLOG FLEXPEN inject 22 units subcutaneously  three times a day with meals   insulin degludec 100 UNIT/ML Sopn FlexTouch Pen Commonly known as:  TRESIBA FLEXTOUCH Inject 0.2 mLs (20 Units total) into the skin daily.   Insulin Pen Needle 32G X 4 MM Misc Commonly known as:  BD PEN NEEDLE NANO U/F Use one per day   liraglutide 18 MG/3ML Sopn Commonly known as:  VICTOZA Inject 0.3 mLs (1.8 mg total) into the skin daily.   metFORMIN 500 MG tablet Commonly known as:  GLUCOPHAGE TAKE 1 TABLET BY MOUTH 3 TIMES DAILY WITH MORNING, NOON AND EVENING MEAL   Vitamin D (Ergocalciferol) 50000 units Caps capsule Commonly  known as:  DRISDOL       Allergies:  Allergies  Allergen Reactions  . Codeine     hallucinations    Past Medical History:  Diagnosis Date  . Diabetes mellitus without complication (HCC)     No past surgical history on file.  Family History  Problem Relation Age of Onset  . Diabetes Mother   . Heart disease Mother   . Diabetes Father   . Heart disease Father   . Diabetes Sister   . Heart disease Sister     Social History:  reports that she has never smoked. She has never used smokeless tobacco. Her alcohol and drug histories are not on file.  Review of Systems:    She has not been on lipid-lowering medications  No recent labs available from PCP  No results found for: CHOL, HDL, LDLCALC, LDLDIRECT, TRIG, CHOLHDL    She has been treated for vitamin D deficiency    Examination:   BP 134/76   Pulse 94   Temp 97.9 F (36.6 C)   Resp 16   Ht 5\' 8"  (1.727 m)   Wt 191 lb (86.6 kg)   SpO2 97%   BMI 29.04 kg/m   Body mass index is 29.04 kg/m.   ASSESSMENT/ PLAN:   Diabetes type 2 on insulin See history of present illness for detailed discussion of current diabetes management, blood sugar patterns and problems identified  Patient is consistently noncompliant with taking her medications, consistent monitoring and random follow-up. Has consistently poor control with A1c now over 10% from inadequate treatments She did not call about possible side effects from Victoza and is taking only 0.6 mg instead of 1.8 Discussed with patient that Victoza does not cause muscle cramps and she had taken it without side effects previously Also explained to her that Novolog does not cause unilateral depending on the face Currently does not have a meter at home  Recommendations:  She will change her metformin to 1 at lunchtime and 2 at suppertime instead of 3 times a day  Increase Novolog 16 at lunch and suppertime  Adjust Novolog based on meal size and carbohydrate intake  but also see if her blood sugars after meals are controlled  Will start Tresiba to control fasting blood sugars which are significantly high  She will start with 20 units and giver her a titration schedule for increasingly every 3 days by 2 units until fasting blood sugars are under 130 consistently  Victoza 1.2 mg daily at least  Consistent glucose monitoring including  blood sugars after dinner and lunch  Will need to get reports of lipids from PCP appointment made for her to come back on 10/09/16, likely needs to be in a statin drug  Bring meter on each visit for download  Patient Instructions  Check blood sugars on waking up  daily   Also check blood sugars about 2 hours after a meal and do this after different meals by rotation  Recommended blood sugar levels on waking up is 90-130 and about 2 hours after meal is 130-160  Please bring your blood sugar monitor to each visit, thank you  1.2 Victoza  Novolog 16 units before meals  Metformin 1 at lunch and 2 at dinner    Counseling time on subjects discussed above is over 50% of today's 25 minute visit    Kathleen Reid 09/12/2016, 7:56 AM

## 2016-09-11 NOTE — Patient Instructions (Signed)
Check blood sugars on waking up daily   Also check blood sugars about 2 hours after a meal and do this after different meals by rotation  Recommended blood sugar levels on waking up is 90-130 and about 2 hours after meal is 130-160  Please bring your blood sugar monitor to each visit, thank you  1.2 Victoza  Novolog 16 units before meals  Metformin 1 at lunch and 2 at dinner

## 2016-09-27 ENCOUNTER — Other Ambulatory Visit: Payer: Self-pay

## 2016-09-27 ENCOUNTER — Telehealth: Payer: Self-pay | Admitting: Endocrinology

## 2016-09-27 MED ORDER — LIRAGLUTIDE 18 MG/3ML ~~LOC~~ SOPN: 1.8000 mg | PEN_INJECTOR | Freq: Every day | SUBCUTANEOUS | 3 refills | Status: DC

## 2016-09-27 NOTE — Telephone Encounter (Signed)
Pt [facility] of [facility] [address]. She leaves tomorrow morning for a flight and needs it before she leaves.

## 2016-10-05 ENCOUNTER — Other Ambulatory Visit: Payer: BC Managed Care – PPO

## 2016-10-09 ENCOUNTER — Ambulatory Visit: Payer: BC Managed Care – PPO | Admitting: Endocrinology

## 2016-10-14 ENCOUNTER — Other Ambulatory Visit: Payer: Self-pay | Admitting: Endocrinology

## 2016-10-16 ENCOUNTER — Other Ambulatory Visit (INDEPENDENT_AMBULATORY_CARE_PROVIDER_SITE_OTHER): Payer: BC Managed Care – PPO

## 2016-10-16 DIAGNOSIS — Z794 Long term (current) use of insulin: Secondary | ICD-10-CM | POA: Diagnosis not present

## 2016-10-16 DIAGNOSIS — E1165 Type 2 diabetes mellitus with hyperglycemia: Secondary | ICD-10-CM

## 2016-10-16 LAB — BASIC METABOLIC PANEL
BUN: 12 mg/dL (ref 6–23)
CHLORIDE: 99 meq/L (ref 96–112)
CO2: 32 mEq/L (ref 19–32)
CREATININE: 0.64 mg/dL (ref 0.40–1.20)
Calcium: 9.4 mg/dL (ref 8.4–10.5)
GFR: 121.72 mL/min (ref >60.00)
Glucose, Bld: 178 mg/dL — ABNORMAL HIGH (ref 70–99)
POTASSIUM: 4 meq/L (ref 3.5–5.1)
Sodium: 137 mEq/L (ref 135–145)

## 2016-10-16 LAB — LIPID PANEL
CHOL/HDL RATIO: 5
Cholesterol: 198 mg/dL (ref 0–200)
HDL: 39.6 mg/dL (ref >39.00)
LDL CALC: 129 mg/dL — AB (ref 0–99)
NONHDL: 158.01
Triglycerides: 143 mg/dL (ref 0.0–149.0)
VLDL: 28.6 mg/dL (ref 0.0–40.0)

## 2016-10-17 LAB — FRUCTOSAMINE: FRUCTOSAMINE: 331 umol/L — AB (ref 0–285)

## 2016-10-19 ENCOUNTER — Other Ambulatory Visit: Payer: Self-pay

## 2016-10-19 ENCOUNTER — Encounter: Payer: Self-pay | Admitting: Endocrinology

## 2016-10-19 ENCOUNTER — Ambulatory Visit (INDEPENDENT_AMBULATORY_CARE_PROVIDER_SITE_OTHER): Payer: BC Managed Care – PPO | Admitting: Endocrinology

## 2016-10-19 VITALS — BP 146/80 | HR 68 | Ht 68.0 in | Wt 198.0 lb

## 2016-10-19 DIAGNOSIS — E1165 Type 2 diabetes mellitus with hyperglycemia: Secondary | ICD-10-CM

## 2016-10-19 DIAGNOSIS — Z794 Long term (current) use of insulin: Secondary | ICD-10-CM | POA: Diagnosis not present

## 2016-10-19 DIAGNOSIS — E782 Mixed hyperlipidemia: Secondary | ICD-10-CM

## 2016-10-19 MED ORDER — ROSUVASTATIN CALCIUM 5 MG PO TABS: 5.0000 mg | ORAL_TABLET | Freq: Every day | ORAL | 3 refills | Status: DC

## 2016-10-19 NOTE — Patient Instructions (Addendum)
Check blood sugars on waking up  3x weekly  Also check blood sugars about 2 hours after a meal and do this after different meals by rotation  Recommended blood sugar levels on waking up is 90-130 and about 2 hours after meal is 130-160  Please bring your blood sugar monitor to each visit, thank you  Tresiba 24 units, adjusting based on am sugars  Novolog 12 units at meals, 9-10 with smaller meals

## 2016-10-19 NOTE — Progress Notes (Signed)
Patient ID: Kathleen LernerSandra G Reid, female   DOB: 08/25/1956, 60 y.o.   MRN: [id number]    Reason for Appointment: Diabetes follow-up   History of Present Illness   Diagnosis: Type 2 DIABETES MELITUS,  long-standing  Previous history: She has been on a variety of different regimens for diabetes management over the last few years and has had difficulty getting consistent controlled. She was started on Victoza in 8/14 when her A1c was 8% and had improvement in her blood sugars  RECENT history:   Insulin regimen:  Tresiba 20 units daily, Novolog 10 units before meals, 3x a day          Non-insulin hypoglycemic drugs: Metformin 1500 mg daily, Victoza 1.2 mg daily.   Her A1c as of 10/17 is much higher at 10.7, previously 9.8  Fructosamine is still high at 331 although better than before  Current blood sugar patterns, management and problems identified:  On her last visit she was told to take her Victoza 1.2 mg daily instead of 0.6 and she is taking this regularly without side effects  She has checked her blood sugars irregularly again and only once or twice in the mornings before breakfast  Most of her blood sugars are high and she has only a couple of relatively good readings, once early morning and once around 10 PM  Home glucose average is 176 and lab glucose was 178.  She is trying to exercise more and trying to do this at least 3 days a week now.  She thinks her diet this time is fairly good usually   Side effects from medications: None  Proper timing of medications in relation to meals: Yes.          Monitors blood glucose:      Glucometer: Accu-Chek           Blood Glucose readings from  download  Glucose range 112-242 with most readings midday and afternoon and only one normal reading at 10 PM           Meals: 3 meals per day. Breakfast 7 am, 12 noon, 5:30    Physical activity: exercise: walking and Gym 3/7           Wt Readings from Last 3 Encounters:  10/19/16 198 lb  (89.8 kg)  09/11/16 191 lb (86.6 kg)  03/06/16 202 lb 6.4 oz (91.8 kg)   Lab Results  Component Value Date   HGBA1C 10.7 09/11/2016   HGBA1C 9.8 (H) 02/25/2016   HGBA1C 6.7 (H) 05/17/2015   Lab Results  Component Value Date   MICROALBUR 1.3 09/04/2016   LDLCALC 129 (H) 10/16/2016   CREATININE 0.64 10/16/2016    OTHER active problems: See review of systems   Lab on 10/16/2016  Component Date Value Ref Range Status  . Cholesterol 10/16/2016 198  0 - 200 mg/dL Final  . Triglycerides 10/16/2016 143.0  0.0 - 149.0 mg/dL Final  . HDL 10/27/253611/13/2017 39.60  >39.00 mg/dL Final  . VLDL 64/40/347411/13/2017 28.6  0.0 - 40.0 mg/dL Final  . LDL Cholesterol 10/16/2016 129* 0 - 99 mg/dL Final  . Total CHOL/HDL Ratio 10/16/2016 5   Final  . NonHDL 10/16/2016 158.01   Final  . Fructosamine 10/17/2016 331* 0 - 285 umol/L Final   Comment: Published reference interval for apparently healthy subjects between age 60 and 2960 is 2205 - 285 umol/L and in a poorly controlled diabetic population is 228 - 563 umol/L with a mean of 396 umol/L.   .Marland Kitchen  Sodium 10/16/2016 137  135 - 145 mEq/L Final  . Potassium 10/16/2016 4.0  3.5 - 5.1 mEq/L Final  . Chloride 10/16/2016 99  96 - 112 mEq/L Final  . CO2 10/16/2016 32  19 - 32 mEq/L Final  . Glucose, Bld 10/16/2016 178* 70 - 99 mg/dL Final  . BUN 16/09/9603 12  6 - 23 mg/dL Final  . Creatinine, Ser 10/16/2016 0.64  0.40 - 1.20 mg/dL Final  . Calcium 54/08/8118 9.4  8.4 - 10.5 mg/dL Final  . GFR 14/78/2956 121.72  >60.00 mL/min Final      Medication List       Accurate as of 10/19/16  8:58 AM. Always use your most recent med list.          ACCU-CHEK SMARTVIEW test strip Generic drug:  glucose blood TEST three times a day   insulin aspart 100 UNIT/ML FlexPen Commonly known as:  NOVOLOG FLEXPEN inject 22 units subcutaneously three times a day with meals   insulin degludec 100 UNIT/ML Sopn FlexTouch Pen Commonly known as:  TRESIBA FLEXTOUCH Inject 0.2 mLs (20  Units total) into the skin daily.   Insulin Pen Needle 32G X 4 MM Misc Commonly known as:  BD PEN NEEDLE NANO U/F Use one per day   liraglutide 18 MG/3ML Sopn Commonly known as:  VICTOZA Inject 0.3 mLs (1.8 mg total) into the skin daily.   metFORMIN 500 MG tablet Commonly known as:  GLUCOPHAGE 1 tablet at breakfast and 2 at dinner   Vitamin D (Ergocalciferol) 50000 units Caps capsule Commonly known as:  DRISDOL       Allergies:  Allergies  Allergen Reactions  . Codeine     hallucinations    Past Medical History:  Diagnosis Date  . Diabetes mellitus without complication (HCC)     No past surgical history on file.  Family History  Problem Relation Age of Onset  . Diabetes Mother   . Heart disease Mother   . Diabetes Father   . Heart disease Father   . Diabetes Sister   . Heart disease Sister     Social History:  reports that she has never smoked. She has never used smokeless tobacco. Her alcohol and drug histories are not on file.  Review of Systems:    She has not been on lipid-lowering medications, previously had been on Lipitor Recent lipid panel Shows her LDL to be again high over 213 Has not been recommended treatment by PCP   Lab Results  Component Value Date   CHOL 198 10/16/2016   HDL 39.60 10/16/2016   LDLCALC 129 (H) 10/16/2016   TRIG 143.0 10/16/2016   CHOLHDL 5 10/16/2016      She has been treated for vitamin D deficiency  She usually does not have high blood pressure readings, BP is relatively higher today, she has been reluctant to consider medications   Examination:   BP (!) 146/80   Pulse 68   Ht 5\' 8"  (1.727 m)   Wt 198 lb (89.8 kg)   BMI 30.11 kg/m   Body mass index is 30.11 kg/m.   ASSESSMENT/ PLAN:   Diabetes type 2 on insulin See history of present illness for detailed discussion of current diabetes management, blood sugar patterns and problems identified  The patient's diabetes control appears to be somewhat better  with adding basal insulin on her last visit However not clear what her fasting readings are recently and has had a couple of readings in the mornings  which are not consistent Also has mild increase in blood sugars the rest of the day Only 1 time she has checked after supper Again not clear if she is going to be able to reduce her insulin even with increasing Victoza and continuing metformin She is somewhat concerned about the number of injections she is taking  Recommendations:  She will consider the V-go pump, this was described to her today and she is willing to try this, most likely will need a 20 unit basal  Meanwhile increased Tresiba to 24 units  Discussed adjusting the dose of [place]iba based on fasting blood sugar patterns  Increase NovoLog to 12 units with every meal unless eating a smaller meal with low carbohydrate intake  Consider consultation with dietitian  More blood sugars after various meals including supper along with breakfast and lunch  No change in Victoza as yet  She will start CRESTOR for persistent hyperlipidemia  Continue to follow blood pressure    Patient Instructions  Check blood sugars on waking up    Also check blood sugars about 2 hours after a meal and do this after different meals by rotation  Recommended blood sugar levels on waking up is 90-130 and about 2 hours after meal is 130-160  Please bring your blood sugar monitor to each visit, thank you     Counseling time on subjects discussed above is over 50% of today's 25 minute visit    Kathleen Reid 10/19/2016, 8:58 AM    Kathleen Reid 10/19/2016, 8:58 AM   Note: This office note was prepared with Dragon voice recognition system technology. Any transcriptional errors that result from this process are unintentional.

## 2016-11-24 ENCOUNTER — Other Ambulatory Visit: Payer: Self-pay | Admitting: Endocrinology

## 2016-12-13 ENCOUNTER — Encounter: Payer: BC Managed Care – PPO | Admitting: Nutrition

## 2016-12-20 ENCOUNTER — Ambulatory Visit: Payer: BC Managed Care – PPO | Admitting: Endocrinology

## 2017-01-22 ENCOUNTER — Encounter: Payer: BC Managed Care – PPO | Admitting: Nutrition

## 2017-01-26 ENCOUNTER — Ambulatory Visit: Payer: BC Managed Care – PPO | Admitting: Endocrinology

## 2017-01-26 DIAGNOSIS — Z0289 Encounter for other administrative examinations: Secondary | ICD-10-CM

## 2017-05-10 ENCOUNTER — Emergency Department (HOSPITAL_COMMUNITY): Payer: BC Managed Care – PPO

## 2017-05-10 ENCOUNTER — Telehealth: Payer: Self-pay | Admitting: Endocrinology

## 2017-05-10 ENCOUNTER — Other Ambulatory Visit: Payer: Self-pay

## 2017-05-10 ENCOUNTER — Encounter (HOSPITAL_COMMUNITY): Payer: Self-pay | Admitting: Emergency Medicine

## 2017-05-10 DIAGNOSIS — G43909 Migraine, unspecified, not intractable, without status migrainosus: Secondary | ICD-10-CM | POA: Insufficient documentation

## 2017-05-10 DIAGNOSIS — F17200 Nicotine dependence, unspecified, uncomplicated: Secondary | ICD-10-CM | POA: Insufficient documentation

## 2017-05-10 DIAGNOSIS — Z7984 Long term (current) use of oral hypoglycemic drugs: Secondary | ICD-10-CM | POA: Insufficient documentation

## 2017-05-10 DIAGNOSIS — R51 Headache: Secondary | ICD-10-CM | POA: Diagnosis present

## 2017-05-10 DIAGNOSIS — R0789 Other chest pain: Secondary | ICD-10-CM | POA: Diagnosis not present

## 2017-05-10 DIAGNOSIS — E119 Type 2 diabetes mellitus without complications: Secondary | ICD-10-CM | POA: Diagnosis not present

## 2017-05-10 DIAGNOSIS — Z79899 Other long term (current) drug therapy: Secondary | ICD-10-CM | POA: Insufficient documentation

## 2017-05-10 LAB — BASIC METABOLIC PANEL
Anion gap: 9 (ref 5–15)
BUN: 14 mg/dL (ref 6–20)
CHLORIDE: 99 mmol/L — AB (ref 101–111)
CO2: 24 mmol/L (ref 22–32)
CREATININE: 0.62 mg/dL (ref 0.44–1.00)
Calcium: 9.1 mg/dL (ref 8.9–10.3)
GFR calc Af Amer: >60 mL/min (ref >60)
GFR calc non Af Amer: >60 mL/min (ref >60)
Glucose, Bld: 300 mg/dL — ABNORMAL HIGH (ref 65–99)
POTASSIUM: 4.2 mmol/L (ref 3.5–5.1)
Sodium: 132 mmol/L — ABNORMAL LOW (ref 135–145)

## 2017-05-10 LAB — CBC
HEMATOCRIT: 39.6 % (ref 36.0–46.0)
Hemoglobin: 13.1 g/dL (ref 12.0–15.0)
MCH: 27 pg (ref 26.0–34.0)
MCHC: 33.1 g/dL (ref 30.0–36.0)
MCV: 81.6 fL (ref 78.0–100.0)
Platelets: 315 10*3/uL (ref 150–400)
RBC: 4.85 MIL/uL (ref 3.87–5.11)
RDW: 13 % (ref 11.5–15.5)
WBC: 6.7 10*3/uL (ref 4.0–10.5)

## 2017-05-10 LAB — I-STAT TROPONIN, ED: Troponin i, poc: 0 ng/mL (ref 0.00–0.08)

## 2017-05-10 NOTE — Telephone Encounter (Signed)
**  Remind patient they can make refill requests via MyChart**  Medication refill request (Name & Dosage): metFORMIN (GLUCOPHAGE) 500 MG tablet  insulin aspart (NOVOLOG FLEXPEN) 100 UNIT/ML FlexPen Preferred pharmacy (Name & Address):  [facility] - [place], [place] - [address] [phone] (Phone) [phone] (Fax)      Other comments (if applicable):   Please call patient to advise on rx is placed.

## 2017-05-10 NOTE — ED Triage Notes (Signed)
Patient reports left chest pain with SOB , emesis and headache onset this evening , denies cough or diaphoresis .

## 2017-05-10 NOTE — Telephone Encounter (Signed)
Please advise if okay to refill, has not been seen since 10/2016 and canceled and no showed on her last appointments in January and February 2018.

## 2017-05-10 NOTE — Telephone Encounter (Signed)
Please refuse prescription with note to make appointment

## 2017-05-11 ENCOUNTER — Emergency Department (HOSPITAL_COMMUNITY)
Admission: EM | Admit: 2017-05-11 | Discharge: 2017-05-11 | Disposition: A | Discharge status: Home / Self Care | Payer: BC Managed Care – PPO | Attending: Emergency Medicine | Admitting: Emergency Medicine

## 2017-05-11 DIAGNOSIS — G43009 Migraine without aura, not intractable, without status migrainosus: Secondary | ICD-10-CM

## 2017-05-11 DIAGNOSIS — R079 Chest pain, unspecified: Secondary | ICD-10-CM

## 2017-05-11 LAB — I-STAT TROPONIN, ED: Troponin i, poc: 0 ng/mL (ref 0.00–0.08)

## 2017-05-11 MED ORDER — DEXAMETHASONE SODIUM PHOSPHATE 10 MG/ML IJ SOLN
10.0000 mg | Freq: Once | INTRAMUSCULAR | Status: AC
  Administered 2017-05-11: 10 mg via INTRAVENOUS
  Filled 2017-05-11: qty 1

## 2017-05-11 MED ORDER — DIPHENHYDRAMINE HCL 50 MG/ML IJ SOLN
12.5000 mg | Freq: Once | INTRAMUSCULAR | Status: AC
  Administered 2017-05-11: 12.5 mg via INTRAVENOUS
  Filled 2017-05-11: qty 1

## 2017-05-11 MED ORDER — METOCLOPRAMIDE HCL 5 MG/ML IJ SOLN
10.0000 mg | Freq: Once | INTRAMUSCULAR | Status: AC
  Administered 2017-05-11: 10 mg via INTRAVENOUS
  Filled 2017-05-11: qty 2

## 2017-05-11 NOTE — Telephone Encounter (Signed)
FYI

## 2017-05-11 NOTE — ED Notes (Signed)
Patient Alert and oriented X4. Stable and ambulatory. Patient verbalized understanding of the discharge instructions.  Patient belongings were taken by the patient.  

## 2017-05-11 NOTE — Telephone Encounter (Signed)
I saw the note and I called and spoke with the patient about making an appointment before refilling the rx' s per Dr. Lucianne MussKumar. Patient advised she will be out of town until the end of the month and will call back to schedule an appointment at another time.

## 2017-05-11 NOTE — ED Provider Notes (Signed)
[facility]-EMERGENCY DEPT Provider Note   CSN: [id number] Arrival date & time: 05/10/17  2209     History   Chief Complaint Chief Complaint  Patient presents with  . Chest Pain  . Headache    HPI Kathleen Reid is a 61 y.o. female.  Presents with headache since 5 PM yesterday (05/10/17) similar to previous migraine history. It is associated with vomiting and photophobia. Headache started as a generalized headache that has settled on the right side. No fever. She is also having chest pain x 8-9 days with SOB. She reports that her chest discomfort resolved after vomiting tonight. No history of cardiac problems.     The history is provided by the patient. No language interpreter was used.  Chest Pain   Associated symptoms include headaches, nausea, shortness of breath and vomiting. Pertinent negatives include no fever.  Headache   Associated symptoms include shortness of breath, nausea and vomiting. Pertinent negatives include no fever.    Past Medical History:  Diagnosis Date  . Diabetes mellitus without complication [facility](HCC)     Patient Active Problem List   Diagnosis Date Noted  . Hyperlipidemia 11/05/2014  . Type II or unspecified type diabetes mellitus without mention of complication, not stated as uncontrolled 07/15/2013    Past Surgical History:  Procedure Laterality Date  . ABDOMINAL HYSTERECTOMY    . KNEE SURGERY      OB History    No data available       Home Medications    Prior to Admission medications   Medication Sig Start Date End Date Taking? Authorizing Provider  ACCU-CHEK SMARTVIEW test strip TEST three times a day 04/11/16   Reather LittlerKumar, Ajay, MD  insulin aspart (NOVOLOG FLEXPEN) 100 UNIT/ML FlexPen inject 22 units subcutaneously three times a day with meals Patient taking differently: inject 10 units subcutaneously three times a day with meals 03/15/16   Reather LittlerKumar, Ajay, MD  insulin degludec (TRESIBA FLEXTOUCH) 100 UNIT/ML SOPN FlexTouch Pen Inject 0.2 mLs (20  Units total) into the skin daily. 09/11/16   Reather LittlerKumar, Ajay, MD  Insulin Pen Needle (BD PEN NEEDLE NANO U/F) 32G X 4 MM MISC Use one per day 03/15/16   Reather LittlerKumar, Ajay, MD  liraglutide (VICTOZA) 18 MG/3ML SOPN Inject 0.3 mLs (1.8 mg total) into the skin daily. 09/27/16   Carlus PavlovGherghe, Cristina, MD  metFORMIN (GLUCOPHAGE) 500 MG tablet take 1 tablet by mouth [facility] and [facility] 11/25/16   Reather LittlerKumar, Ajay, MD  rosuvastatin (CRESTOR) 5 MG tablet Take 1 tablet (5 mg total) by mouth daily. 10/19/16   Reather LittlerKumar, Ajay, MD  Vitamin D, Ergocalciferol, (DRISDOL) 50000 UNITS CAPS capsule  09/01/14   [provider]    Family History Family History  Problem Relation Age of Onset  . Diabetes Mother   . Heart disease Mother   . Diabetes Father   . Heart disease Father   . Diabetes Sister   . Heart disease Sister     Social History Social History  Substance Use Topics  . Smoking status: Current Every Day Smoker  . Smokeless tobacco: Never Used  . Alcohol use No     Allergies   Codeine   Review of Systems Review of Systems  Constitutional: Negative for chills and fever.  HENT: Negative.   Respiratory: Positive for shortness of breath.   Cardiovascular: Positive for chest pain.  Gastrointestinal: Positive for nausea and vomiting.  Genitourinary: Negative.   Musculoskeletal: Negative.   Skin: Negative.   Neurological:  Positive for headaches.     Physical Exam Updated Vital Signs BP (!) 154/79 (BP Location: Right Arm)   Pulse 84   Temp 98.4 F (36.9 C) (Oral)   Resp (!) 25   SpO2 94%   Physical Exam  Constitutional: She is oriented to person, place, and time. She appears well-developed and well-nourished.  HENT:  Head: Normocephalic.  Neck: Normal range of motion. Neck supple.  Cardiovascular: Normal rate and regular rhythm.   No murmur heard. Pulmonary/Chest: Effort normal and breath sounds normal. She has no wheezes. She has no rales. She exhibits no tenderness.    Abdominal: Soft. Bowel sounds are normal. There is no tenderness. There is no rebound and no guarding.  Musculoskeletal: Normal range of motion.  Neurological: She is alert and oriented to person, place, and time.  CN's 3-12 grossly intact. Speech is clear and focused. No facial asymmetry. No lateralizing weakness. Reflexes are equal. No deficits of coordination. Ambulatory without imbalance.    Skin: Skin is warm and dry. No rash noted.  Psychiatric: She has a normal mood and affect.     ED Treatments / Results  Labs (all labs ordered are listed, but only abnormal results are displayed) Labs Reviewed  BASIC METABOLIC PANEL - Abnormal; Notable for the following:       Result Value   Sodium 132 (*)    Chloride 99 (*)    Glucose, Bld 300 (*)    All other components within normal limits  CBC  I-STAT TROPOININ, ED   Results for orders placed or performed during the hospital encounter of 05/11/17  Basic metabolic panel  Result Value Ref Range   Sodium 132 (L) 135 - 145 mmol/L   Potassium 4.2 3.5 - 5.1 mmol/L   Chloride 99 (L) 101 - 111 mmol/L   CO2 24 22 - 32 mmol/L   Glucose, Bld 300 (H) 65 - 99 mg/dL   BUN 14 6 - 20 mg/dL   Creatinine, Ser 1.61 0.44 - 1.00 mg/dL   Calcium 9.1 8.9 - 09.6 mg/dL   GFR calc non Af Amer >60 >60 mL/min   GFR calc Af Amer >60 >60 mL/min   Anion gap 9 5 - 15  CBC  Result Value Ref Range   WBC 6.7 4.0 - 10.5 K/uL   RBC 4.85 3.87 - 5.11 MIL/uL   Hemoglobin 13.1 12.0 - 15.0 g/dL   HCT 04.5 40.9 - 81.1 %   MCV 81.6 78.0 - 100.0 fL   MCH 27.0 26.0 - 34.0 pg   MCHC 33.1 30.0 - 36.0 g/dL   RDW 91.4 78.2 - 95.6 %   Platelets 315 150 - 400 K/uL  I-stat troponin, ED  Result Value Ref Range   Troponin i, poc 0.00 0.00 - 0.08 ng/mL   Comment 3          I-stat troponin, ED  Result Value Ref Range   Troponin i, poc 0.00 0.00 - 0.08 ng/mL   Comment 3            EKG  EKG Interpretation None       Radiology Dg Chest 2 View  Result Date:  05/10/2017 CLINICAL DATA:  Left chest pain.  Shortness of breath. EXAM: CHEST  2 VIEW COMPARISON:  Apr 10, 2011 FINDINGS: Minimal opacity in left base is favored to represent atelectasis. The heart, hila, mediastinum, lungs, and pleura are otherwise unremarkable. IMPRESSION: Minimal opacity in left base favored represent atelectasis. No other acute abnormalities.  Electronically Signed   By: Gerome Sam III M.D   On: 05/10/2017 22:52    Procedures Procedures (including critical care time)  Medications Ordered in ED Medications - No data to display   Initial Impression / Assessment and Plan / ED Course  I have reviewed the triage vital signs and the nursing notes.  Pertinent labs & imaging results that were available during my care of the patient were reviewed by me and considered in my medical decision making (see chart for details).     Patient presents with headache typical for her migraine headache history. She is also having chest pain that has been intermittent for the past 10 days.   She has a normal neurologic exam and no current chest pain. The first troponin is negative and EKG is non-acute. She has a Heart Score of 3 so delta troponin was obtained and is negative.   Headache is resolved with headache cocktail of Reglan, Benadryl and Decadron. VSS. She can be discharged home with PCP follow up for recheck.  Final Clinical Impressions(s) / ED Diagnoses   Final diagnoses:  None   1. Migraine headache 2. Nonspecific chest pain  New Prescriptions New Prescriptions   No medications on file     Elpidio Anis, Cordelia Poche 05/11/17 0421    Ward, Layla Maw, DO 05/11/17 [phone]

## 2017-05-16 ENCOUNTER — Telehealth: Payer: Self-pay | Admitting: Internal Medicine

## 2017-05-16 MED ORDER — INSULIN ASPART 100 UNIT/ML FLEXPEN: PEN_INJECTOR | SUBCUTANEOUS | 0 refills | Status: DC

## 2017-05-16 MED ORDER — METFORMIN HCL 500 MG PO TABS: ORAL_TABLET | ORAL | 0 refills | Status: DC

## 2017-05-16 NOTE — Telephone Encounter (Signed)
**  Remind patient they can make refill requests via MyChart**  Medication refill request (Name & Dosage):  insulin aspart (NOVOLOG FLEXPEN) 100 UNIT/ML FlexPen  metFORMIN (GLUCOPHAGE) 500 MG tablet  Preferred pharmacy (Name & Address):  [facility] - [place], [place] - [address]  Other comments (if applicable):   [place]. Sister will pick up script. Not sure of when she will be able to come in for appt.

## 2017-05-16 NOTE — Telephone Encounter (Signed)
Refill submitted. 

## 2019-09-11 ENCOUNTER — Ambulatory Visit: Payer: BC Managed Care – PPO | Admitting: Allergy

## 2019-09-11 ENCOUNTER — Other Ambulatory Visit: Payer: Self-pay

## 2019-09-11 ENCOUNTER — Encounter: Payer: Self-pay | Admitting: Allergy

## 2019-09-11 VITALS — BP 154/72 | HR 90 | Temp 97.6°F | Resp 16 | Ht 66.0 in | Wt 189.2 lb

## 2019-09-11 DIAGNOSIS — R059 Cough, unspecified: Secondary | ICD-10-CM

## 2019-09-11 DIAGNOSIS — J3089 Other allergic rhinitis: Secondary | ICD-10-CM

## 2019-09-11 DIAGNOSIS — R05 Cough: Secondary | ICD-10-CM | POA: Diagnosis not present

## 2019-09-11 MED ORDER — AZELASTINE HCL 0.1 % NA SOLN: 2.0000 | Freq: Two times a day (BID) | NASAL | 5 refills | Status: DC

## 2019-09-11 MED ORDER — FEXOFENADINE HCL 180 MG PO TABS: 180.0000 mg | ORAL_TABLET | Freq: Every day | ORAL | 5 refills | Status: DC

## 2019-09-11 NOTE — Patient Instructions (Addendum)
Allergic rhinitis  - environmental allergy skin testing today is tree pollen, molds, mixed feathers  - allergen avoidance measures discussed/handouts provided  - change Zyrtec to Allegra 180mg    - for nasal drainage recommend use of Astelin 2 sprays each nostril twice a day.  Demonstrated proper nasal spray use today (point tip of bottle towards eye on same side).    Cough  - believe cough is multifactorial with nasal drainage triggering as well as Lisinopril trigger.    Lisinopril can cause cough as a side effect of the medication.  Recommend you discuss this with your PCP to find an alternative medication for blood pressure control.    - will manage the drainage as above  Follow-up 3-4 months or sooner if needed

## 2019-09-11 NOTE — Progress Notes (Signed)
New Patient Note  RE: Kathleen Reid MRN: [id number] DOB: 12/28/1955 Date of Office Visit: 09/11/2019  Referring provider: No ref. provider found Primary care provider: Sandi Mariscal, MD  Chief Complaint: nasal drainage, ear drainage, sneezing  History of present illness: Kathleen Reid is a 63 y.o. female presenting today for evaluation of allergy symptoms.      She reports she is having a lot of nasal drainage with cough and feels she is having ear drainage as well.     She states she had to go to [place] as her daughter was opening a business and needed someone to take care of her grandson.  She states she went there in September 2019 and as soon as she arrived there she developed cough, nasal drainage.  She thought it was due to a difference in climate.  She did she a doctor there who told her she had bronchitis but she did not believe she had bronchitis.  She also reports she was having a lot of nasal drainage and sneezing.  Around January she states the amount of drainage she was developing was causing her to need to spit out the drainage.  She saw another doctor who she states told her she had nasal drainage but was not provided with any relief.   She returned back to [facility] in June 2020 and states the symptoms stopped.  However in September the symptoms returned again.   She has been using as needed benadryl.  She also has used Zyrtec but makes her sleepy.  She states the Zyrtec does "calm the symptoms" down. She denies using any nasal sprays.   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    She takes Lisinopril for BP control.  She states that she notices if she misses her lisinopril the cough is better and when she resumes use the cough does seem to worsen.  She does plan to discuss this with her PCP.     She states she has an eye disease (does not remember the name of disease) that has  resolved but the disease and medications she took for it "ate away my membranes on my eyes".   She states her eye was "growing a tissue that was connected from the top eyelid down".  She states it was blinding her.   She was treated at [facility] and states she had serial treatments where they were "cutting the tissue off".  She also was treated with a "new drug" that needed to be compounded that helped her symptoms.   Denies history of asthma or food allergy.    Review of systems: Review of Systems  Constitutional: Negative for chills, fever and malaise/fatigue.  HENT: Positive for ear discharge. Negative for congestion, nosebleeds and sore throat.   Eyes: Negative for pain, discharge and redness.  Respiratory: Positive for cough. Negative for shortness of breath and wheezing.   Cardiovascular: Negative.   Gastrointestinal: Negative.   Musculoskeletal: Negative.   Skin: Positive for itching and rash.  Neurological: Negative.     All other systems negative unless noted above in HPI  Past medical history: Past Medical History:  Diagnosis Date  . Diabetes mellitus without complication ([place])   . Eye disease     Past surgical history: Past Surgical History:  Procedure Laterality Date  . ABDOMINAL HYSTERECTOMY    . KNEE  SURGERY      Family history:  Family History  Problem Relation Age of Onset  . Diabetes Mother   . Heart disease Mother   . Diabetes Father   . Heart disease Father   . Diabetes Sister   . Heart disease Sister     Social history: Lives in a home with carpeting in the bedroom with electric heating and central cooling.  No pets in the home.  No concern for water damage, mildew or roaches in the home.  Retired Engineer, site.  No smoke exposure.    Medication List: Current Outpatient Medications  Medication Sig Dispense Refill  . ACCU-CHEK SMARTVIEW test strip TEST three times a day 100 each 2  . clobetasol (TEMOVATE) 0.05 % external solution APP 1 ML EXT AA Q  NIGHT    . doxycycline (MONODOX) 100 MG capsule TK 1 C PO BID WF    . FARXIGA 10 MG TABS tablet TK 1 T PO D    . finasteride (PROSCAR) 5 MG tablet TK 1 T PO D    . hydrocortisone 2.5 % cream APP TO EARS BID FOR 2 WEEKS PRF FLARE    . insulin aspart (NOVOLOG FLEXPEN) 100 UNIT/ML FlexPen inject 22 units subcutaneously three times a day with meals 15 mL 0  . Insulin Pen Needle (BD PEN NEEDLE NANO U/F) 32G X 4 MM MISC Use one per day 30 each 5  . lisinopril-hydrochlorothiazide (ZESTORETIC) 10-12.5 MG tablet TK 1 T PO D     No current facility-administered medications for this visit.     Known medication allergies: Allergies  Allergen Reactions  . Codeine     hallucinations    Physical examination: Blood pressure (!) 154/72, pulse 90, temperature 97.6 F (36.4 C), temperature source Temporal, resp. rate 16, height 5\' 6"  (1.676 m), weight 189 lb 3.2 oz (85.8 kg), SpO2 97 %.   Pt took lisinopril in office.    General: Alert, interactive, in no acute distress. HEENT: PERRLA, TMs pearly gray, turbinates moderately edematous with clear discharge, post-pharynx non erythematous. Neck: Supple without lymphadenopathy. Lungs: Clear to auscultation without wheezing, rhonchi or rales. {no increased work of breathing. CV: Normal S1, S2 without murmurs. Abdomen: Nondistended, nontender. Skin:  Rt pinna dry and flaky, Warm and dry, without lesions or rashes. Extremities:  No clubbing, cyanosis or edema. Neuro:   Grossly intact.  Diagnositics/Labs:  Allergy testing: environmental allergy skin prick testing is positive to ash, box elder, alternaria, mixed feathers.  Intradermal testing is negative.  Allergy testing results were read and interpreted by provider, documented by clinical staff.   Assessment and plan:   Allergic rhinitis  - environmental allergy skin testing today is tree pollen, molds, mixed feathers  - allergen avoidance measures discussed/handouts provided  - change Zyrtec to  Allegra 180mg    - for nasal drainage recommend use of Astelin 2 sprays each nostril twice a day.  Demonstrated proper nasal spray use today (point tip of bottle towards eye on same side).    Cough  - believe cough is multifactorial with nasal drainage triggering as well as Lisinopril trigger.    Lisinopril can cause cough as a side effect of the medication.  Recommend you discuss this with your PCP to find an alternative medication for blood pressure control.    - will manage the drainage as above  Follow-up 3-4 months or sooner if needed  I appreciate the opportunity to take part in Kameko's care. Please do not hesitate to  contact me with questions.  Sincerely,   Margo Aye, MD Allergy/Immunology [facility]

## 2019-09-29 ENCOUNTER — Other Ambulatory Visit: Payer: Self-pay

## 2019-09-29 ENCOUNTER — Emergency Department (HOSPITAL_COMMUNITY)
Admission: EM | Admit: 2019-09-29 | Discharge: 2019-09-29 | Disposition: A | Discharge status: Home / Self Care | Payer: BC Managed Care – PPO | Attending: Emergency Medicine | Admitting: Emergency Medicine

## 2019-09-29 ENCOUNTER — Encounter (HOSPITAL_COMMUNITY): Payer: Self-pay

## 2019-09-29 ENCOUNTER — Emergency Department (HOSPITAL_COMMUNITY): Payer: BC Managed Care – PPO

## 2019-09-29 DIAGNOSIS — E119 Type 2 diabetes mellitus without complications: Secondary | ICD-10-CM | POA: Diagnosis not present

## 2019-09-29 DIAGNOSIS — R1032 Left lower quadrant pain: Secondary | ICD-10-CM | POA: Insufficient documentation

## 2019-09-29 DIAGNOSIS — R112 Nausea with vomiting, unspecified: Secondary | ICD-10-CM | POA: Diagnosis not present

## 2019-09-29 DIAGNOSIS — K5900 Constipation, unspecified: Secondary | ICD-10-CM | POA: Insufficient documentation

## 2019-09-29 DIAGNOSIS — Z794 Long term (current) use of insulin: Secondary | ICD-10-CM | POA: Diagnosis not present

## 2019-09-29 DIAGNOSIS — Z79899 Other long term (current) drug therapy: Secondary | ICD-10-CM | POA: Diagnosis not present

## 2019-09-29 DIAGNOSIS — R1031 Right lower quadrant pain: Secondary | ICD-10-CM | POA: Diagnosis present

## 2019-09-29 LAB — LIPASE, BLOOD: Lipase: 28 U/L (ref 11–51)

## 2019-09-29 LAB — COMPREHENSIVE METABOLIC PANEL
ALT: 29 U/L (ref 0–44)
AST: 20 U/L (ref 15–41)
Albumin: 3.8 g/dL (ref 3.5–5.0)
Alkaline Phosphatase: 69 U/L (ref 38–126)
Anion gap: 9 (ref 5–15)
BUN: 10 mg/dL (ref 8–23)
CO2: 23 mmol/L (ref 22–32)
Calcium: 8.5 mg/dL — ABNORMAL LOW (ref 8.9–10.3)
Chloride: 101 mmol/L (ref 98–111)
Creatinine, Ser: 0.58 mg/dL (ref 0.44–1.00)
GFR calc Af Amer: >60 mL/min (ref >60)
GFR calc non Af Amer: >60 mL/min (ref >60)
Glucose, Bld: 220 mg/dL — ABNORMAL HIGH (ref 70–99)
Potassium: 3.9 mmol/L (ref 3.5–5.1)
Sodium: 133 mmol/L — ABNORMAL LOW (ref 135–145)
Total Bilirubin: 0.5 mg/dL (ref 0.3–1.2)
Total Protein: 7.6 g/dL (ref 6.5–8.1)

## 2019-09-29 LAB — CBC
HCT: 43.7 % (ref 36.0–46.0)
Hemoglobin: 13.5 g/dL (ref 12.0–15.0)
MCH: 26.5 pg (ref 26.0–34.0)
MCHC: 30.9 g/dL (ref 30.0–36.0)
MCV: 85.7 fL (ref 80.0–100.0)
Platelets: 303 10*3/uL (ref 150–400)
RBC: 5.1 MIL/uL (ref 3.87–5.11)
RDW: 14.1 % (ref 11.5–15.5)
WBC: 6.9 10*3/uL (ref 4.0–10.5)
nRBC: 0 % (ref 0.0–0.2)

## 2019-09-29 MED ORDER — PROMETHAZINE HCL 25 MG/ML IJ SOLN
25.0000 mg | Freq: Once | INTRAMUSCULAR | Status: AC
  Administered 2019-09-29: 25 mg via INTRAVENOUS
  Filled 2019-09-29: qty 1

## 2019-09-29 MED ORDER — GLYCERIN (ADULT) 2 G RE SUPP: 1.0000 | Freq: Two times a day (BID) | RECTAL | 0 refills | Status: AC

## 2019-09-29 MED ORDER — LACTULOSE 20 G PO PACK: 20.0000 g | PACK | Freq: Two times a day (BID) | ORAL | 0 refills | Status: AC

## 2019-09-29 MED ORDER — SODIUM CHLORIDE 0.9 % IV BOLUS (SEPSIS)
1000.0000 mL | Freq: Once | INTRAVENOUS | Status: AC
  Administered 2019-09-29: 1000 mL via INTRAVENOUS

## 2019-09-29 MED ORDER — KETOROLAC TROMETHAMINE 15 MG/ML IJ SOLN
15.0000 mg | Freq: Once | INTRAMUSCULAR | Status: AC
  Administered 2019-09-29: 15 mg via INTRAVENOUS
  Filled 2019-09-29: qty 1

## 2019-09-29 NOTE — Discharge Instructions (Addendum)
Thank you for allowing me to care for you today. Please return to the emergency department if you have new or worsening symptoms. Take your medications as instructed.  ° °

## 2019-09-29 NOTE — ED Notes (Signed)
Pt had two medium episode of emesis no blood present. Family at bedside reported pt "had fish for lunch". Pt reports last BM "Thursday and umbilical area abd pain".

## 2019-09-29 NOTE — ED Notes (Signed)
X-ray at bedside

## 2019-09-29 NOTE — ED Triage Notes (Signed)
Pt arrived via EMS from home lives alone. Pt c/o abdominal pain ( generalized) , nausea, vomiting and constipation.   **BP 160/70, HR 60, RR 22, O2  sat 98% RA, CBG 241   20G Rt hand 500CC NS 4mg  Zofran

## 2019-09-29 NOTE — ED Provider Notes (Signed)
[facility]-EMERGENCY DEPT Provider Note   CSN: [id number] Arrival date & time: 09/29/19  1626     History   Chief Complaint Chief Complaint  Patient presents with  . Abdominal Pain  . Nausea  . Emesis    HPI Kathleen Reid is a 63 y.o. female.     63 year old female with past medical history of diabetes presenting to the emergency department for abdominal pain, nausea, vomiting.  Patient reports that abdominal pain began yesterday.  It is worse in the bilateral lower quadrants that radiates into the upper quadrants.  She reports that she has not had a bowel movement since past Thursday.  Reports that she has the urge to have a bowel movement but it hurts when she tries to push.  She reports that she is not having nausea with vomiting.  Denies any history of obstruction or previous abdominal surgeries.  Denies any dysuria, fever, chills, URI symptoms.  She did try milk of magnesia and MiraLAX at home without results.     Past Medical History:  Diagnosis Date  . Diabetes mellitus without complication (HCC)   . Eye disease     Patient Active Problem List   Diagnosis Date Noted  . Hyperlipidemia 11/05/2014  . Type II or unspecified type diabetes mellitus without mention of complication, not stated as uncontrolled 07/15/2013    Past Surgical History:  Procedure Laterality Date  . ABDOMINAL HYSTERECTOMY    . KNEE SURGERY       OB History   No obstetric history on file.      Home Medications    Prior to Admission medications   Medication Sig Start Date End Date Taking? Authorizing Provider  ACCU-CHEK SMARTVIEW test strip TEST three times a day 04/11/16   Reather Littler, MD  azelastine (ASTELIN) 0.1 % nasal spray Place 2 sprays into both nostrils 2 (two) times daily. 09/11/19   Marcelyn Bruins, MD  clobetasol (TEMOVATE) 0.05 % external solution APP 1 ML EXT AA Q NIGHT 09/03/19   [provider]  doxycycline (MONODOX) 100 MG  capsule TK 1 C PO BID WF 09/04/19   [provider]  FARXIGA 10 MG TABS tablet TK 1 T PO D 08/20/19   [provider]  fexofenadine (ALLEGRA) 180 MG tablet Take 1 tablet (180 mg total) by mouth daily. 09/11/19   Marcelyn Bruins, MD  finasteride (PROSCAR) 5 MG tablet TK 1 T PO D 09/03/19   [provider]  hydrocortisone 2.5 % cream APP TO EARS BID FOR 2 WEEKS PRF FLARE 07/24/19   [provider]  insulin aspart (NOVOLOG FLEXPEN) 100 UNIT/ML FlexPen inject 22 units subcutaneously three times a day with meals 05/16/17   Reather Littler, MD  Insulin Pen Needle (BD PEN NEEDLE NANO U/F) 32G X 4 MM MISC Use one per day 03/15/16   Reather Littler, MD  lisinopril-hydrochlorothiazide (ZESTORETIC) 10-12.5 MG tablet TK 1 T PO D 09/03/19   [provider]    Family History Family History  Problem Relation Age of Onset  . Diabetes Mother   . Heart disease Mother   . Diabetes Father   . Heart disease Father   . Diabetes Sister   . Heart disease Sister     Social History Social History   Tobacco Use  . Smoking status: Never Smoker  . Smokeless tobacco: Never Used  Substance Use Topics  . Alcohol use: No  . Drug use: No  Allergies   Codeine   Review of Systems Review of Systems  Constitutional: Negative for chills and fever.  HENT: Negative for congestion and rhinorrhea.   Respiratory: Negative for cough and shortness of breath.   Cardiovascular: Negative for chest pain.  Gastrointestinal: Positive for abdominal pain, constipation, nausea and vomiting. Negative for abdominal distention, anal bleeding, blood in stool, diarrhea and rectal pain.  Genitourinary: Negative for decreased urine volume, difficulty urinating, dysuria, flank pain and vaginal discharge.  Musculoskeletal: Negative for back pain.  Neurological: Negative for dizziness, light-headedness and headaches.     Physical Exam Updated Vital Signs BP (!) 160/74 (BP Location: Right  Arm)   Pulse 79   Temp 98.5 F (36.9 C) (Oral)   Resp 18   Ht 5\' 6"  (1.676 m)   Wt 85.8 kg   SpO2 95%   BMI 30.54 kg/m   Physical Exam Vitals signs and nursing note reviewed.  Constitutional:      Appearance: Normal appearance. She is well-developed. She is not ill-appearing, toxic-appearing or diaphoretic.     Comments: Appears in pain  HENT:     Head: Normocephalic.  Eyes:     Conjunctiva/sclera: Conjunctivae normal.  Cardiovascular:     Rate and Rhythm: Normal rate and regular rhythm.  Pulmonary:     Effort: Pulmonary effort is normal.  Abdominal:     General: Abdomen is flat. Bowel sounds are decreased.     Palpations: Abdomen is soft.     Tenderness: There is generalized abdominal tenderness.     Hernia: No hernia is present.  Skin:    General: Skin is warm and dry.  Neurological:     Mental Status: She is alert.  Psychiatric:        Mood and Affect: Mood normal.      ED Treatments / Results  Labs (all labs ordered are listed, but only abnormal results are displayed) Labs Reviewed  LIPASE, BLOOD  COMPREHENSIVE METABOLIC PANEL  CBC  URINALYSIS, ROUTINE W REFLEX MICROSCOPIC    EKG None  Radiology No results found.  Procedures Procedures (including critical care time)  Medications Ordered in ED Medications  sodium chloride 0.9 % bolus 1,000 mL (has no administration in time range)     Initial Impression / Assessment and Plan / ED Course  I have reviewed the triage vital signs and the nursing notes.  Pertinent labs & imaging results that were available during my care of the patient were reviewed by me and considered in my medical decision making (see chart for details).  Clinical Course as of Sep 30 1639  Mon Sep 29, 2019  1749 63 y/o F with T2DM presenting with 2 days of abdominal pain and no BM x4 days with n/v. Afebrile. Normal CBCand CMP revealing 133 sodium which is not new for her. Normal lipase. Pending plain film of abdomen. Suspect  constipation vs obstruction. Patient received a liter of NS and 15mg  toradol as well as phenergan for nausea   [KM]  1842 Patient improved with toradol and phenergan. Abdominal xray revealinglarge stool burden but no other acute finding. Discussed findings with patient and treatments. Patient and family member at bedside request an enema here in the ED rather than outpatient treatment due to patient's discomfort.Will order this now for patient   [KM]  2058 Patient had some bowel movement with enema.  Reports mild improvement of symptoms.  Patient niece is at bedside.  Discussed digitally disimpacted the patient versus sending home on outpatient medications.  They report that they would rather go home on outpatient medications.  Advised on drinking plenty of water and following up with primary care doctor.  They also would like a referral back to the nutritionist to follow-up with the patient's diabetes control.Also request pulmonology referral. Has been followed by PMD for chronic cough for 2 yrs. No change today   [KM]    Clinical Course User Index [KM] Alveria Apley, PA-C       Based on review of vitals, medical screening exam, lab work and/or imaging, there does not appear to be an acute, emergent etiology for the patient's symptoms. Counseled pt on good return precautions and encouraged both PCP and ED follow-up as needed.  Prior to discharge, I also discussed incidental imaging findings with patient in detail and advised appropriate, recommended follow-up in detail.  Clinical Impression: 1. Constipation, unspecified constipation type     Disposition: Discharge  Prior to providing a prescription for a controlled substance, I independently reviewed the patient's recent prescription history on the [place]. The patient had no recent or regular prescriptions and was deemed appropriate for a brief, less than 3 day prescription of narcotic for acute  analgesia.  This note was prepared with assistance of Systems analyst. Occasional wrong-word or sound-a-like substitutions may have occurred due to the inherent limitations of voice recognition software.   Final Clinical Impressions(s) / ED Diagnoses   Final diagnoses:  None    ED Discharge Orders    None       Kristine Royal 09/30/19 1640    Dorie Rank, MD 09/30/19 1726

## 2019-10-10 ENCOUNTER — Ambulatory Visit: Payer: BC Managed Care – PPO | Admitting: Internal Medicine

## 2019-10-10 ENCOUNTER — Other Ambulatory Visit: Payer: Self-pay

## 2019-10-10 ENCOUNTER — Ambulatory Visit (INDEPENDENT_AMBULATORY_CARE_PROVIDER_SITE_OTHER): Payer: BC Managed Care – PPO

## 2019-10-10 ENCOUNTER — Encounter: Payer: Self-pay | Admitting: Internal Medicine

## 2019-10-10 DIAGNOSIS — R058 Other specified cough: Secondary | ICD-10-CM

## 2019-10-10 DIAGNOSIS — R05 Cough: Secondary | ICD-10-CM

## 2019-10-10 MED ORDER — PREDNISONE 10 MG PO TABS: ORAL_TABLET | ORAL | 0 refills | Status: DC

## 2019-10-10 MED ORDER — BENZONATATE 200 MG PO CAPS: 200.0000 mg | ORAL_CAPSULE | Freq: Three times a day (TID) | ORAL | 1 refills | Status: DC | PRN

## 2019-10-10 MED ORDER — FAMOTIDINE 20 MG PO TABS: ORAL_TABLET | ORAL | 11 refills | Status: DC

## 2019-10-10 MED ORDER — PANTOPRAZOLE SODIUM 40 MG PO TBEC: 40.0000 mg | DELAYED_RELEASE_TABLET | Freq: Every day | ORAL | 2 refills | Status: DC

## 2019-10-10 NOTE — Patient Instructions (Addendum)
For cough tessalon 200 mg every 6 hr as needed   Pantoprazole (protonix) 40 mg   Take  30-60 min before first meal of the day and Pepcid (famotidine)  20 mg one @  bedtime until return to office - this is the best way to tell whether stomach acid is contributing to your problem.     For drainage / throat tickle try take CHLORPHENIRAMINE  4 mg  (Chlortab 4mg   at [employer] should be easiest to find in the green box)  take one every 4 hours as needed - available over the counter- may cause drowsiness so start with just a bedtime dose or two and see how you tolerate it before trying in daytime   Prednisone 10 mg take  4 each am x 2 days,   2 each am x 2 days,  1 each am x 2 days and stop   GERD (REFLUX)  is an extremely common cause of respiratory symptoms just like yours , many times with no obvious heartburn at all.    It can be treated with medication, but also with lifestyle changes including elevation of the head of your bed (ideally with 6 -8inch blocks under the headboard of your bed),  Smoking cessation, avoidance of late meals, excessive alcohol, and avoid fatty foods, chocolate, peppermint, colas, red wine, and acidic juices such as orange juice.  NO MINT OR MENTHOL PRODUCTS SO NO COUGH DROPS  USE SUGARLESS CANDY INSTEAD (Jolley ranchers or Stover's or Life Savers) or even ice chips will also do - the key is to swallow to prevent all throat clearing. NO OIL BASED VITAMINS - use powdered substitutes.  Avoid fish oil when coughing.    Please remember to go to the  x-ray department  for your tests - we will call you with the results when they are available     Please schedule a follow up office visit in 2 weeks, sooner if needed  with all medications /inhalers/ solutions in hand so we can verify exactly what you are taking. This includes all medications from all doctors and over the counters

## 2019-10-10 NOTE — Assessment & Plan Note (Signed)
Onset Sept 2019  - d/c acei mid Oct 2020  - 10/10/2019 max rx for gerd/ .1st gen H1 blockers per guidelines   Upper airway cough syndrome (previously labeled PNDS),  is so named because it's frequently impossible to sort out how much is  CR/sinusitis with freq throat clearing (which can be related to primary GERD)   vs  causing  secondary (" extra esophageal")  GERD from wide swings in gastric pressure that occur with throat clearing, often  promoting self use of mint and menthol lozenges that reduce the lower esophageal sphincter tone and exacerbate the problem further in a cyclical fashion.   These are the same pts (now being labeled as having "irritable larynx syndrome" by some cough centers) who not infrequently have a history of having failed to tolerate ace inhibitors,  dry powder inhalers or biphosphonates or report having atypical/extraesophageal reflux symptoms that don't respond to standard doses of PPI  and are easily confused as having aecopd or asthma flares by even experienced allergists/ pulmonologists (myself included).    Of the three most common causes of  Sub-acute / recurrent or chronic cough, only one (GERD)  can actually contribute to/ trigger  the other two (asthma and post nasal drip syndrome)  and perpetuate the cylce of cough.  While not intuitively obvious, many patients with chronic low grade reflux do not cough until there is a primary insult that disturbs the protective epithelial barrier and exposes sensitive nerve endings.   This is typically viral but can due to PNDS and  either may apply here.     >>>  The point is that once this occurs, it is difficult to eliminate the cycle  using anything but a maximally effective acid suppression regimen at least in the short run, accompanied by an appropriate diet to address non acid GERD and control / eliminate the cough  With tessalon, avoiding opioids if possible plus 1st gen H1 blockers per guidelines  And also added 6 days of  Prednisone in case of component of Th-2 driven upper or lower airways inflammation (if cough responds short term only to relapse befor return while will on rx for uacs that would point to allergic rhinitis/ asthma or eos bronchitis)   Cautioned re transient increase glucose levels with prednisone  >>> r/u in 2 weeks with all meds in hand using a trust but verify approach to confirm accurate Medication  Reconciliation The principal here is that until we are certain that the  patients are doing what we've asked, it makes no sense to ask them to do more.     Total time devoted to counseling  > 50 % of initial 60 min office visit:  review case with pt/ discussion of options/alternatives/ personally creating written customized instructions  in presence of pt  then going over those specific  Instructions directly with the pt including how to use all of the meds but in particular covering each new medication in detail and the difference between the maintenance= "automatic" meds and the prns using an action plan format for the latter (If this problem/symptom => do that organization reading Left to right).  Please see AVS from this visit for a full list of these instructions which I personally wrote for this pt and  are unique to this visit.

## 2019-10-10 NOTE — Progress Notes (Signed)
Spoke with pt and notified of results per Dr. Wert. Pt verbalized understanding and denied any questions. 

## 2019-10-10 NOTE — Progress Notes (Signed)
Kathleen Reid, female    DOB: 13-Aug-1956      MRN: [id number]   Brief patient profile:  34 yobf never smoker never allergies never breathing problems landed in  in [place] Sept 2019 and w/in week  started coughing no w/u or treatment > arrived 05/2019 back in [place] and seemed better s any rx then end of sept 2020 September started back > allergy by Bary Leriche 09/11/19 stop lisinopril and stopped it but only some better so referred to pulmonary clinic byPadgett      History of Present Illness  10/10/2019  Pulmonary/ 1st office eval/Kiing Deakin re cough :  off lisinopril since mid Oct 2020  Chief Complaint  Patient presents with  . Pulmonary Consult    Self referral. Pt c/o cough off and on for the past 6 - 9 months.  Cough is occ prod with clear sputum. Cough tends to be worse when she lies down. She was taken off of ACE 3 wks ago and this has helped the cough some.   Dyspnea:  If not coughing does fine though no aerobics Cough: immediately on lying down min mucoid prod, feels like something stuck / gag but no vomit/ better with cough drops / about 50 % better off acei Sleep: 4 x per night SABA use: none   No obvious day to day or daytime variability or assoc excess/ purulent sputum or mucus plugs or hemoptysis or cp or chest tightness, subjective wheeze or overt sinus or hb symptoms.   Sleeping  without nocturnal  or early am exacerbation  of respiratory  c/o's or need for noct saba. Also denies any obvious fluctuation of symptoms with weather or environmental changes or other aggravating or alleviating factors except as outlined above   No unusual exposure hx or h/o childhood pna/ asthma or knowledge of premature birth.  Current Allergies, Complete Past Medical History, Past Surgical History, Family History, and Social History were reviewed in [employer] record.  ROS  The following are not active complaints unless bolded Hoarseness, sore throat, dysphagia,= globus sensation  dental problems, itching, sneezing,  nasal congestion or discharge of excess mucus or purulent secretions, ear ache,   fever, chills, sweats, unintended wt loss or wt gain, classically pleuritic or exertional cp,  orthopnea pnd or arm/hand swelling  or leg swelling, presyncope, palpitations, abdominal pain, anorexia, nausea, vomiting, diarrhea  or change in bowel habits or change in bladder habits, change in stools or change in urine, dysuria, hematuria,  rash, arthralgias, visual complaints, headache, numbness, weakness or ataxia or problems with walking or coordination,  change in mood or  memory.           Past Medical History:  Diagnosis Date  . Diabetes mellitus without complication ([place])   . Eye disease     Outpatient Medications Prior to Visit  Medication Sig Dispense Refill  . ACCU-CHEK SMARTVIEW test strip TEST three times a day 100 each 2  . azelastine (ASTELIN) 0.1 % nasal spray Place 2 sprays into both nostrils 2 (two) times daily. 30 mL 5  . clobetasol (TEMOVATE) 0.05 % external solution APP 1 ML EXT AA Q NIGHT    . doxycycline (MONODOX) 100 MG capsule TK 1 C PO BID WF    . FARXIGA 10 MG TABS tablet TK 1 T PO D    . fexofenadine (ALLEGRA) 180 MG tablet Take 1 tablet (180 mg total) by mouth daily. 30 tablet 5  . finasteride (PROSCAR) 5 MG  tablet TK 1 T PO D    . Insulin Degludec (TRESIBA FLEXTOUCH) 200 UNIT/ML SOPN Inject into the skin See admin instructions.    . Insulin Pen Needle (BD PEN NEEDLE NANO U/F) 32G X 4 MM MISC Use one per day 30 each 5  . valsartan (DIOVAN) 80 MG tablet Take 80 mg by mouth daily.    . hydrocortisone 2.5 % cream APP TO EARS BID FOR 2 WEEKS PRF FLARE    . insulin aspart (NOVOLOG FLEXPEN) 100 UNIT/ML FlexPen inject 22 units subcutaneously three times a day with meals 15 mL 0  .           Objective:     BP 130/80 (BP Location: Left Arm, Cuff Size: Normal)   Pulse 87   Temp (!) 97 F (36.1 C) (Temporal)   Ht 5' 5.5" (1.664 m)   Wt 189 lb (85.7  kg)   SpO2 97% Comment: on RA  BMI 30.97 kg/m   SpO2: 97 %(on RA)     HEENT : pt wearing mask not removed for exam due to covid -19 concerns.    NECK :  without JVD/Nodes/TM/ nl carotid upstrokes bilaterally   LUNGS: no acc muscle use,  Nl contour chest which is clear to A and P bilaterally with cough on early  insp  maneuvers   CV:  RRR  no s3 or murmur or increase in P2, and no edema   ABD:  soft and nontender with nl inspiratory excursion in the supine position. No bruits or organomegaly appreciated, bowel sounds nl  MS:  Nl gait/ ext warm without deformities, calf tenderness, cyanosis or clubbing No obvious joint restrictions   SKIN: warm and dry without lesions    NEURO:  alert, approp, nl sensorium with  no motor or cerebellar deficits apparent.      CXR PA and Lateral:   10/10/2019 :    I personally reviewed images and agree with radiology impression as follows:    No active cardiopulmonary disease.       Assessment   No problem-specific Assessment & Plan notes found for this encounter.     Sandrea Hughs, MD 10/10/2019

## 2019-10-24 ENCOUNTER — Ambulatory Visit: Payer: BC Managed Care – PPO | Admitting: Internal Medicine

## 2019-11-10 ENCOUNTER — Ambulatory Visit: Payer: BC Managed Care – PPO | Admitting: Endocrinology

## 2019-11-25 ENCOUNTER — Ambulatory Visit: Payer: BC Managed Care – PPO | Admitting: Endocrinology

## 2020-02-07 ENCOUNTER — Ambulatory Visit: Payer: BC Managed Care – PPO | Attending: Internal Medicine

## 2020-02-07 DIAGNOSIS — Z23 Encounter for immunization: Secondary | ICD-10-CM

## 2020-02-07 NOTE — Progress Notes (Signed)
   Covid-19 Vaccination Clinic  Name:  Kathleen Reid    MRN: [id number] DOB: September 30, 1956  02/07/2020  Kathleen Reid was observed post Covid-19 immunization for 15 minutes without incident. She was provided with Vaccine Information Sheet and instruction to access the V-Safe system.   Kathleen Reid was instructed to call 911 with any severe reactions post vaccine: Marland Kitchen Difficulty breathing  . Swelling of face and throat  . A fast heartbeat  . A bad rash all over body  . Dizziness and weakness   Immunizations Administered    Name Date Dose VIS Date Route   Pfizer COVID-19 Vaccine 02/07/2020 11:11 AM 0.3 mL 11/14/2019 Intramuscular   Manufacturer: [employer], [employer]   Lot: [id number]   NDC: [address]-0

## 2020-02-28 ENCOUNTER — Ambulatory Visit: Payer: BC Managed Care – PPO | Attending: Internal Medicine

## 2020-02-28 DIAGNOSIS — Z23 Encounter for immunization: Secondary | ICD-10-CM

## 2020-02-28 NOTE — Progress Notes (Signed)
   Covid-19 Vaccination Clinic  Name:  Kathleen Reid    MRN: [id number] DOB: 11-15-1956  02/28/2020  Kathleen Reid was observed post Covid-19 immunization for 15 minutes without incident. She was provided with Vaccine Information Sheet and instruction to access the V-Safe system.   Kathleen Reid was instructed to call 911 with any severe reactions post vaccine: Marland Kitchen Difficulty breathing  . Swelling of face and throat  . A fast heartbeat  . A bad rash all over body  . Dizziness and weakness   Immunizations Administered    Name Date Dose VIS Date Route   Pfizer COVID-19 Vaccine 02/28/2020  8:48 AM 0.3 mL 11/14/2019 Intramuscular   Manufacturer: [employer], [employer]   Lot: [id number]   NDC: [address]-1

## 2020-07-13 IMAGING — DX DG ABDOMEN 1V
2 series · 2 of 2 positions shown · non-contrast
Comparison: None.

CLINICAL DATA: Abdominal pain. Concern for constipation.

EXAM:
ABDOMEN - 1 VIEW

[abdomen kub (1 of 2)]
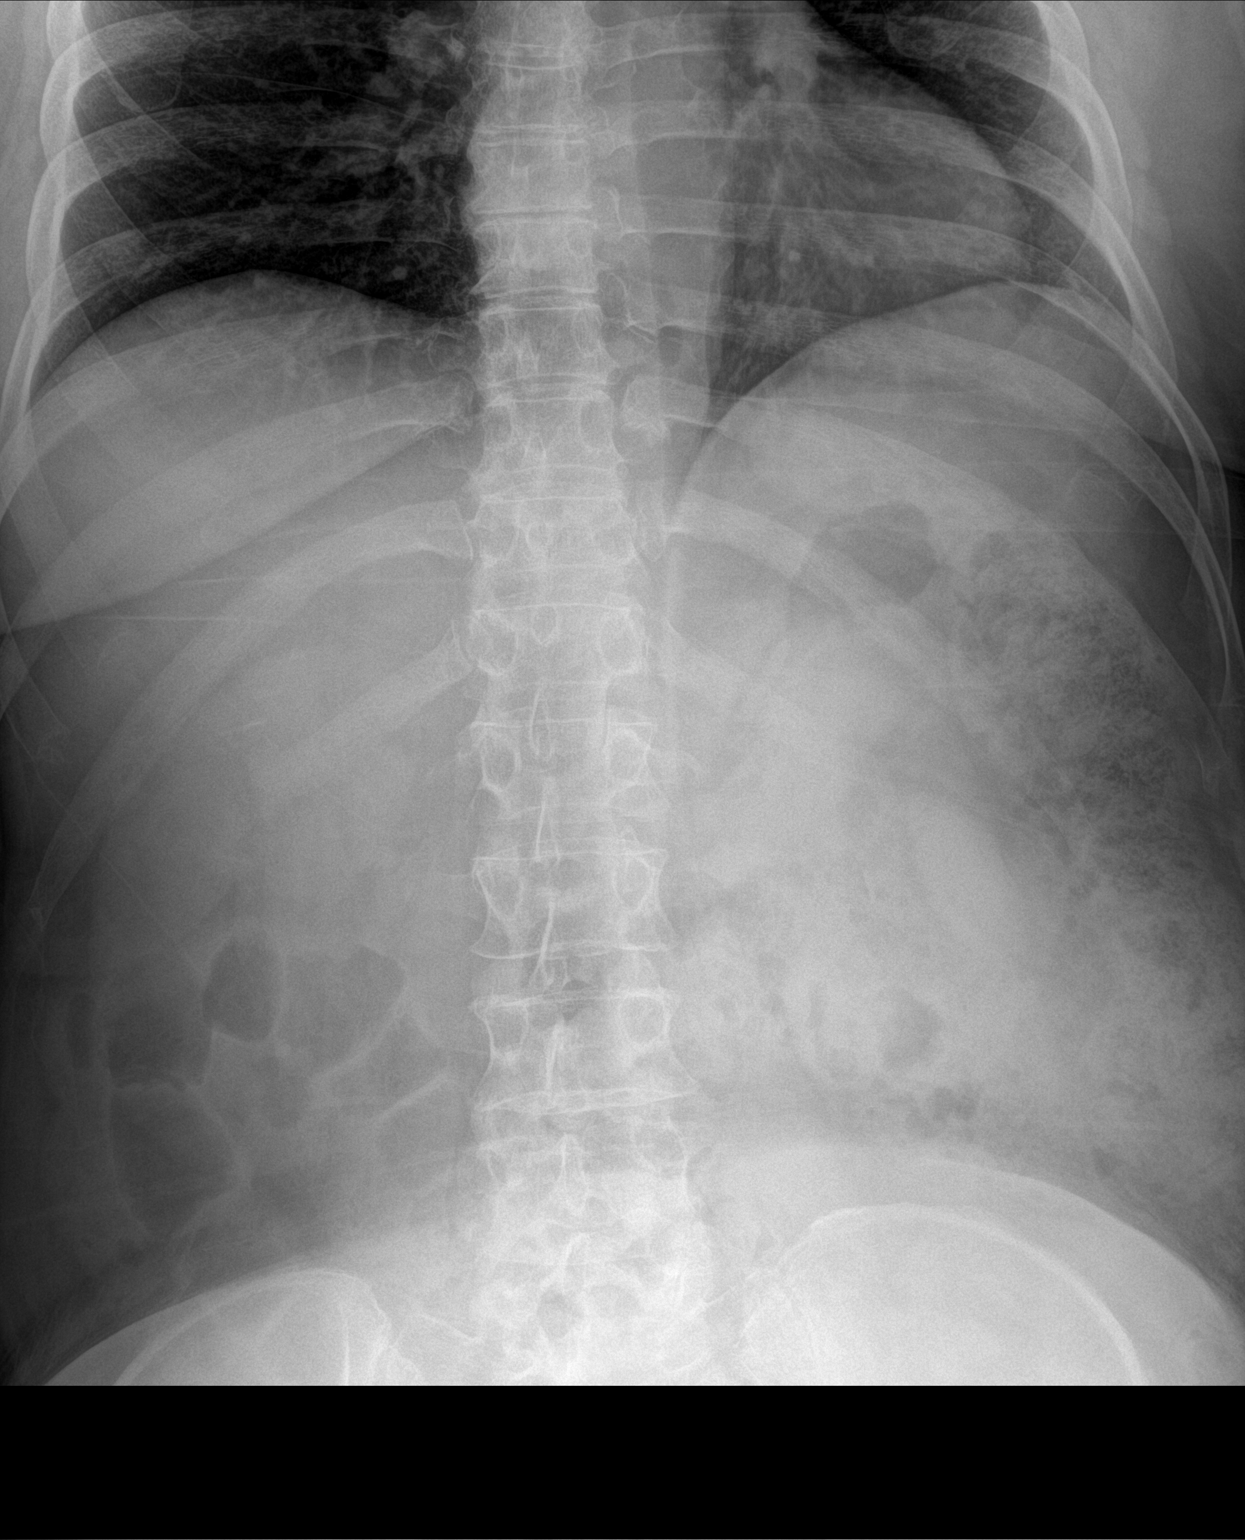

[abdomen kub (2 of 2)]
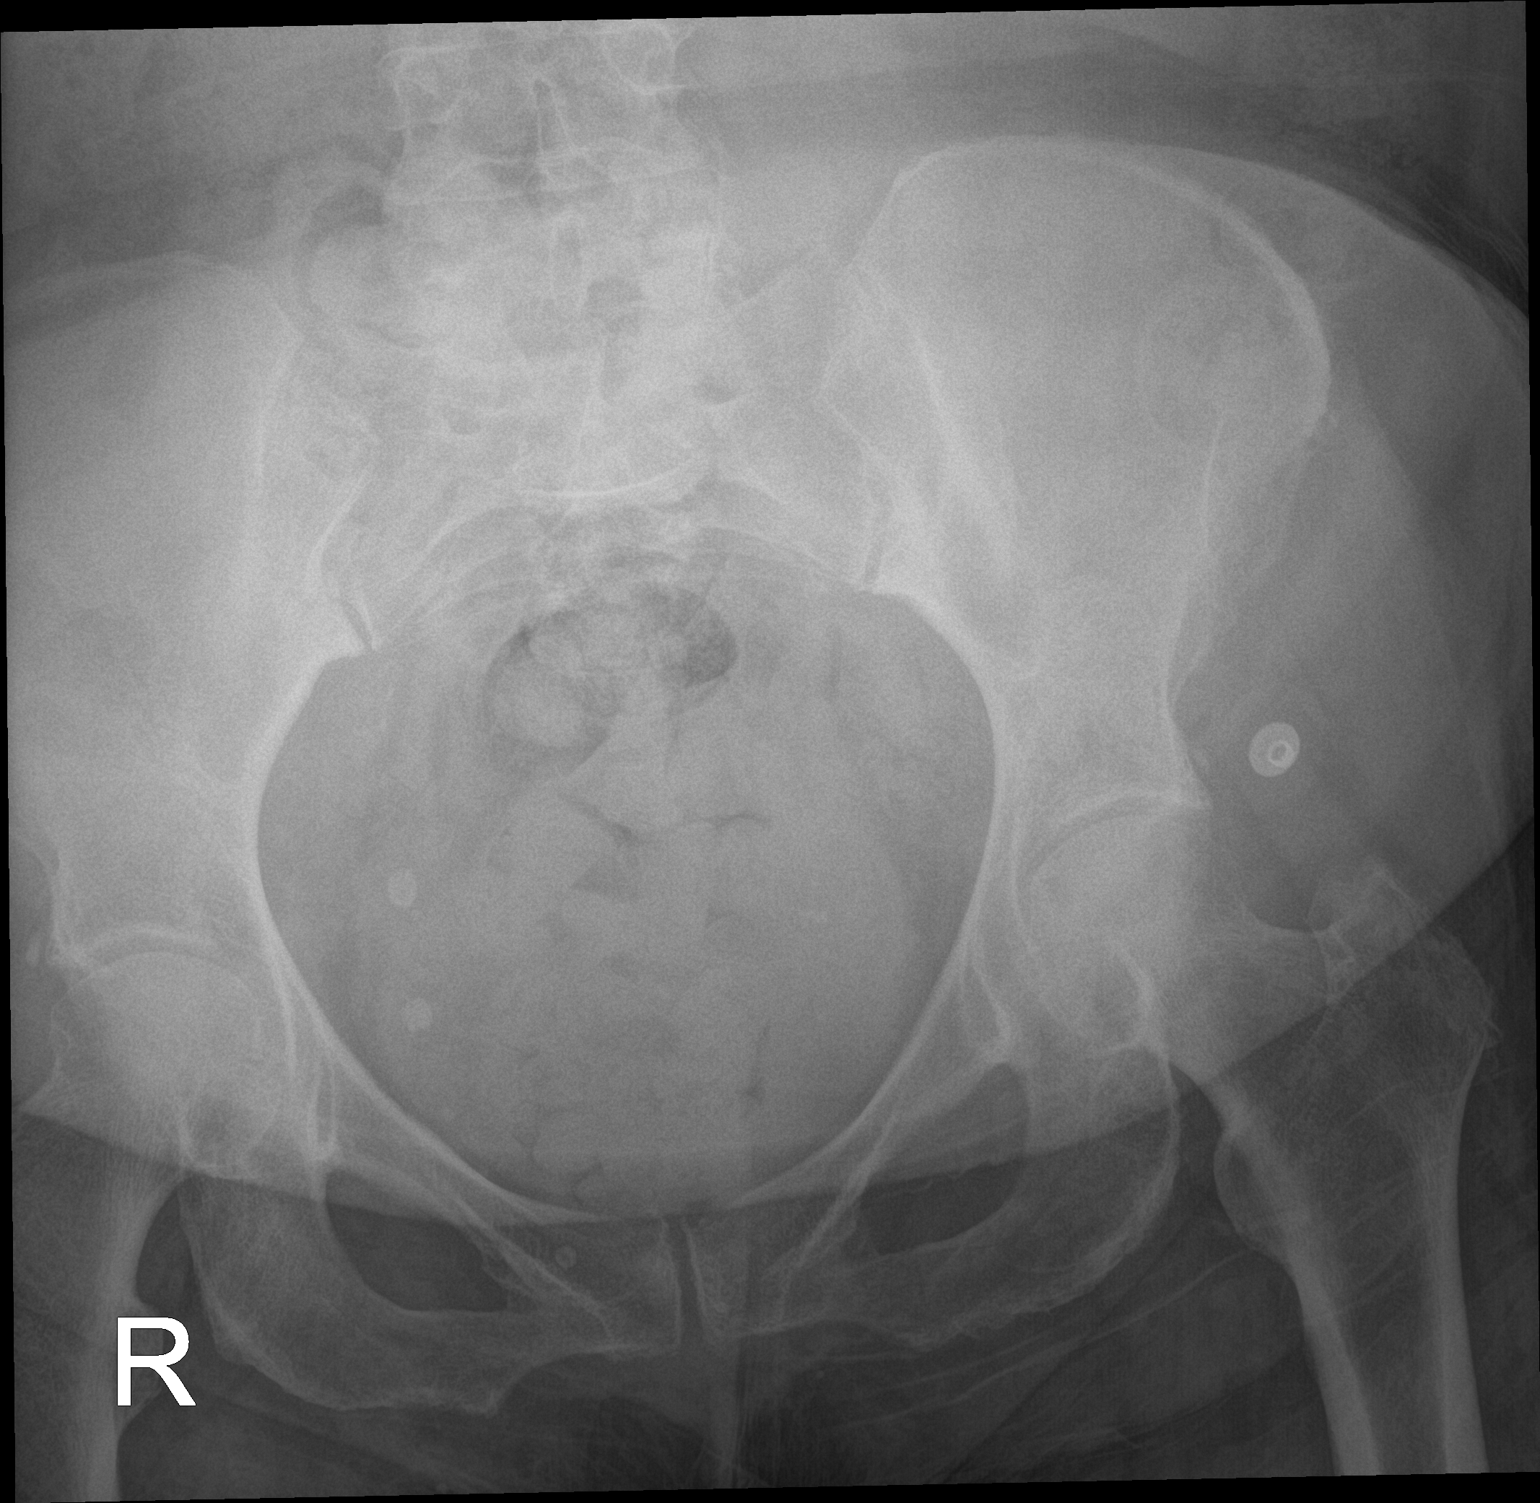

[2 of 2 positions shown; findings below may reference images not displayed]

FINDINGS: The bowel gas pattern is nonobstructive. There is a large amount of
stool throughout the colon and rectum. There is no definite
pneumatosis or free air. There are calcifications projecting over
the patient's pelvis which are favored to represent phleboliths.
IMPRESSION: 1. Nonobstructive bowel gas pattern.
2. Large stool burden.

## 2020-07-24 IMAGING — DX DG CHEST 2V
2 series · 2 of 2 positions shown · non-contrast
Comparison: 05/10/2017

CLINICAL DATA: Cough, upper airway cough syndrome evaluation.

EXAM:
CHEST - 2 VIEW

[chest pa]
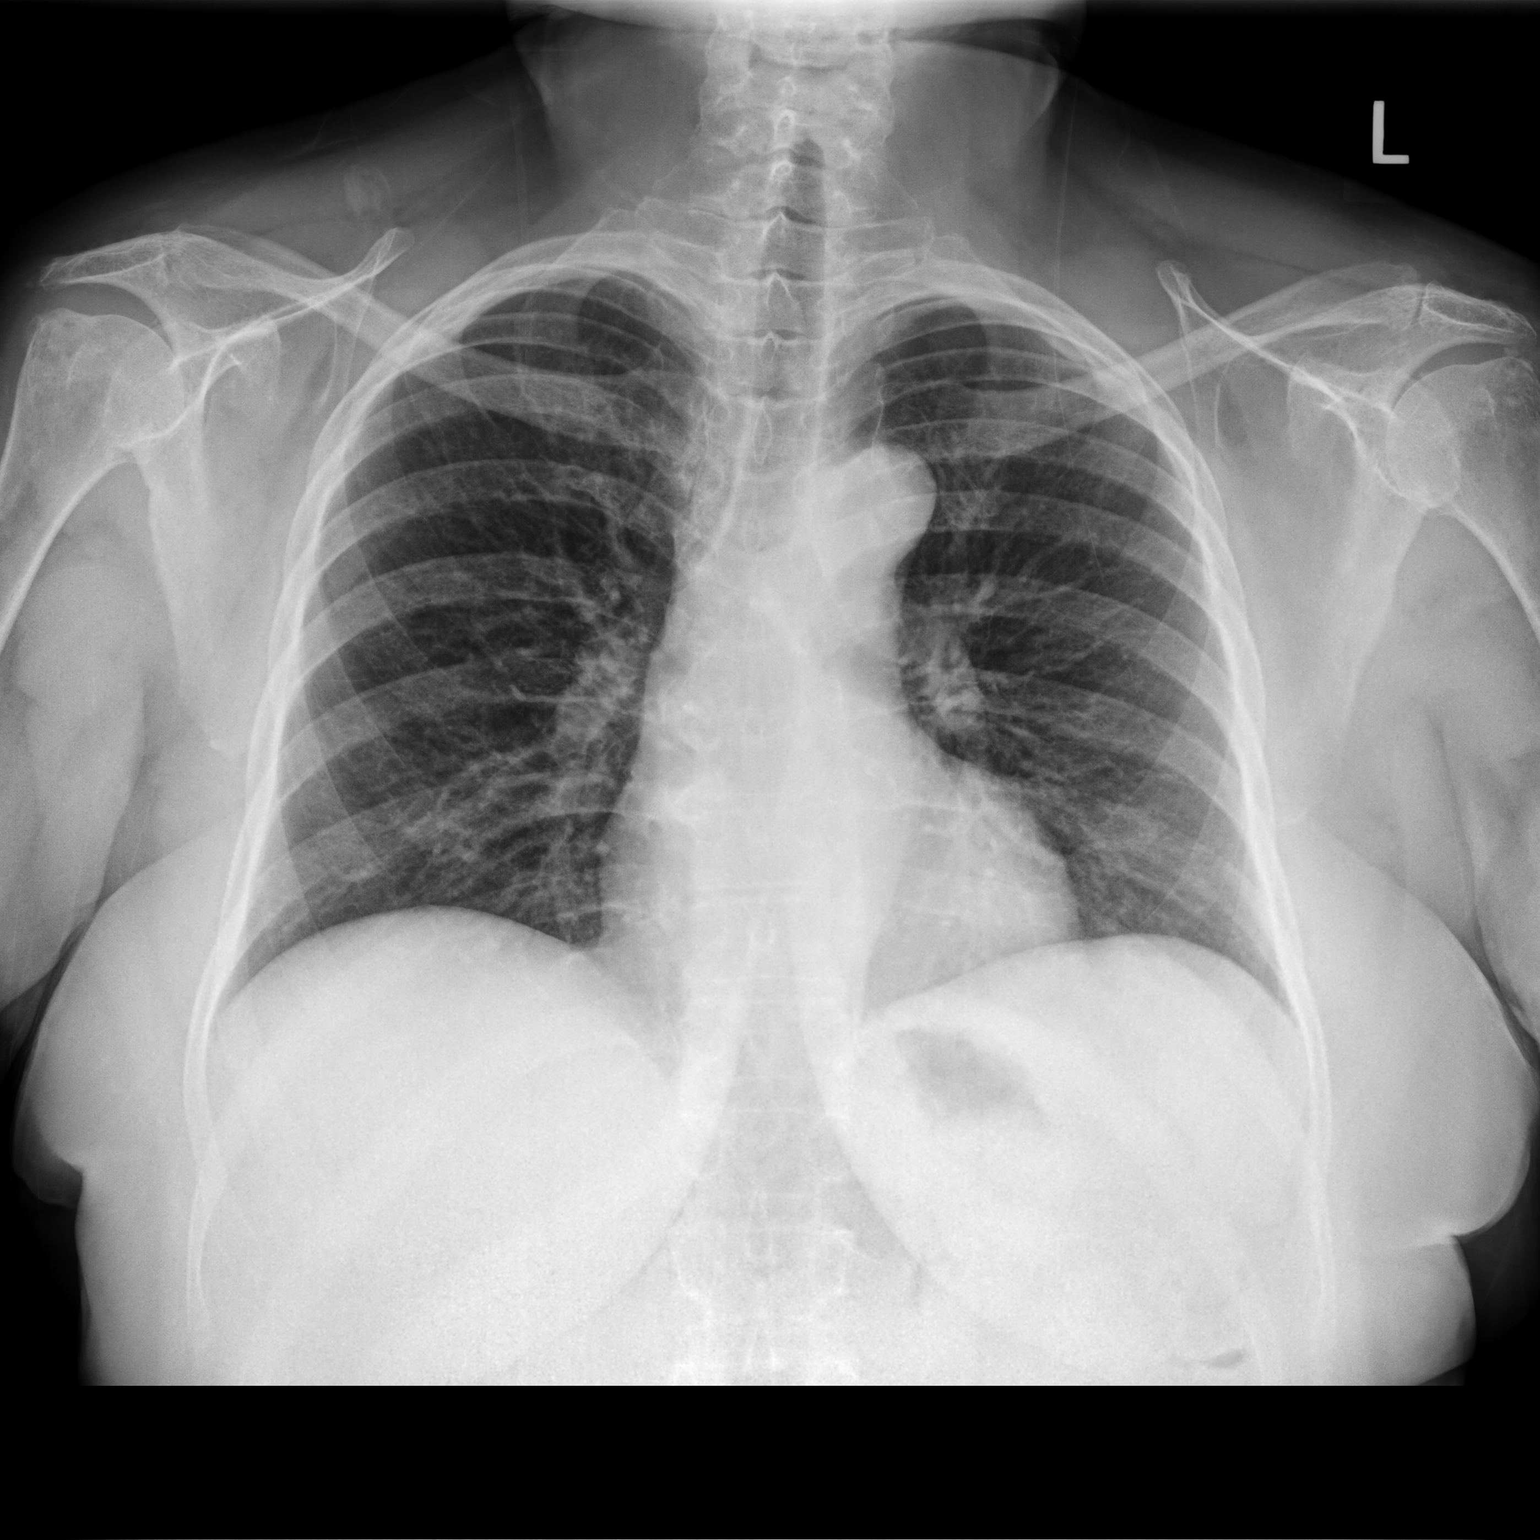

[chest lat]
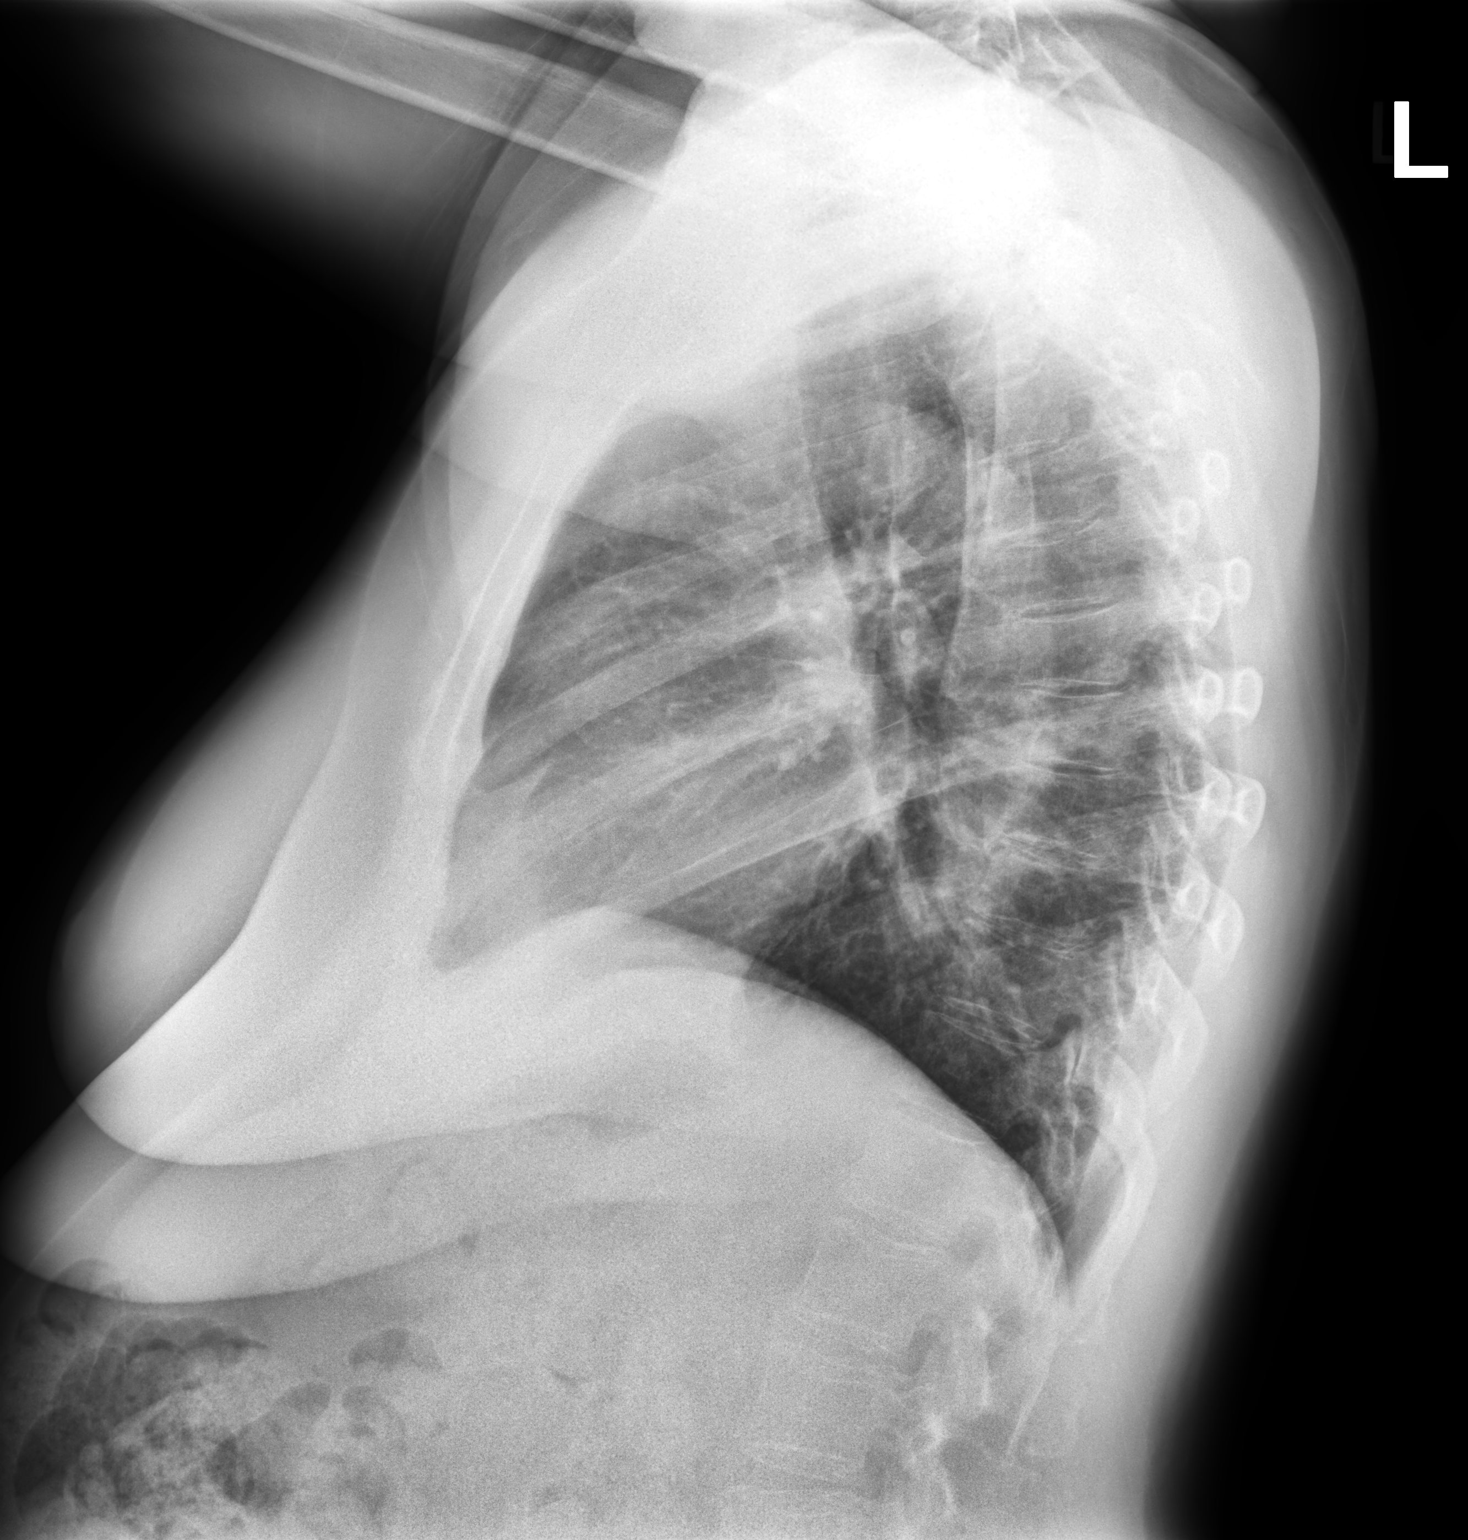

[2 of 2 positions shown; findings below may reference images not displayed]

FINDINGS: Lungs are adequately inflated and otherwise clear. Cardiomediastinal
silhouette and remainder of the exam is unchanged.
IMPRESSION: No active cardiopulmonary disease.

## 2021-04-09 ENCOUNTER — Other Ambulatory Visit: Payer: Self-pay

## 2021-04-09 ENCOUNTER — Ambulatory Visit (HOSPITAL_COMMUNITY)
Admission: EM | Admit: 2021-04-09 | Discharge: 2021-04-09 | Disposition: A | Discharge status: Home / Self Care | Payer: BC Managed Care – PPO | Attending: Emergency Medicine | Admitting: Emergency Medicine

## 2021-04-09 ENCOUNTER — Encounter (HOSPITAL_COMMUNITY): Payer: Self-pay | Admitting: *Deleted

## 2021-04-09 DIAGNOSIS — J302 Other seasonal allergic rhinitis: Secondary | ICD-10-CM | POA: Diagnosis not present

## 2021-04-09 MED ORDER — BENZONATATE 100 MG PO CAPS: 100.0000 mg | ORAL_CAPSULE | Freq: Three times a day (TID) | ORAL | 0 refills | Status: DC | PRN

## 2021-04-09 MED ORDER — FEXOFENADINE-PSEUDOEPHED ER 60-120 MG PO TB12: 1.0000 | ORAL_TABLET | Freq: Two times a day (BID) | ORAL | 0 refills | Status: DC

## 2021-04-09 NOTE — Discharge Instructions (Signed)
Get plenty of rest and push fluids Tessalon Perles prescribed for cough Prescribed as prescribed seasonal allergy symptoms  use medications daily for symptom relief Use OTC medications like ibuprofen or tylenol as needed fever or pain Call or go to the ED if you have any new or worsening symptoms such as fever, worsening cough, shortness of breath, chest tightness, chest pain, turning blue, changes in mental status, etc..Marland Kitchen

## 2021-04-09 NOTE — ED Triage Notes (Signed)
Pt reports after mowing yard she developed a sore throat and cough.

## 2021-04-09 NOTE — ED Provider Notes (Signed)
[facility]   [id number] 04/09/21 Arrival Time: 1017   Chief Complaint  Patient presents with  . Sore Throat    SUBJECTIVE: History from: patient.  Kathleen Reid is a 65 y.o. female who presents to the urgent care with a complaint of.  Sore throat and cough that started few days ago.  Developed the symptom after mowing grass.  Denies COVID, flu exposure.  Denies recent travel.  Has not tried any OTC medication.  Denies alleviating or aggravating factors.  Reports similar symptoms in the past and was treated with Allegra.   Denies fever, chills, fatigue, sinus pain, rhinorrhea,  SOB, wheezing, chest pain, nausea, changes in bowel or bladder habits.    ROS: As per HPI.  All other pertinent ROS negative.     Past Medical History:  Diagnosis Date  . Diabetes mellitus without complication (HCC)   . Eye disease    Past Surgical History:  Procedure Laterality Date  . ABDOMINAL HYSTERECTOMY    . KNEE SURGERY     Allergies  Allergen Reactions  . Codeine     hallucinations   No current facility-administered medications on file prior to encounter.   Current Outpatient Medications on File Prior to Encounter  Medication Sig Dispense Refill  . ACCU-CHEK SMARTVIEW test strip TEST three times a day 100 each 2  . azelastine (ASTELIN) 0.1 % nasal spray Place 2 sprays into both nostrils 2 (two) times daily. 30 mL 5  . clobetasol (TEMOVATE) 0.05 % external solution APP 1 ML EXT AA Q NIGHT    . doxycycline (MONODOX) 100 MG capsule TK 1 C PO BID WF    . famotidine (PEPCID) 20 MG tablet One after supper 30 tablet 11  . FARXIGA 10 MG TABS tablet TK 1 T PO D    . finasteride (PROSCAR) 5 MG tablet TK 1 T PO D    . Insulin Degludec (TRESIBA FLEXTOUCH) 200 UNIT/ML SOPN Inject into the skin See admin instructions.    . Insulin Pen Needle (BD PEN NEEDLE NANO U/F) 32G X 4 MM MISC Use one per day 30 each 5  . pantoprazole (PROTONIX) 40 MG tablet Take 1 tablet (40 mg total) by mouth  daily. Take 30-60 min before first meal of the day 30 tablet 2  . predniSONE (DELTASONE) 10 MG tablet Take  4 each am x 2 days,   2 each am x 2 days,  1 each am x 2 days and stop 14 tablet 0  . valsartan (DIOVAN) 80 MG tablet Take 80 mg by mouth daily.     Social History   Socioeconomic History  . Marital status: Divorced    Spouse name: Not on file  . Number of children: Not on file  . Years of education: Not on file  . Highest education level: Not on file  Occupational History  . Not on file  Tobacco Use  . Smoking status: Never Smoker  . Smokeless tobacco: Never Used  Vaping Use  . Vaping Use: Never used  Substance and Sexual Activity  . Alcohol use: No  . Drug use: No  . Sexual activity: Not on file  Other Topics Concern  . Not on file  Social History Narrative  . Not on file   Social Determinants of Health   Financial Resource Strain: Not on file  Food Insecurity: Not on file  Transportation Needs: Not on file  Physical Activity: Not on file  Stress: Not on file  Social  Connections: Not on file  Intimate Partner Violence: Not on file   Family History  Problem Relation Age of Onset  . Diabetes Mother   . Heart disease Mother   . Diabetes Father   . Heart disease Father   . Diabetes Sister   . Heart disease Sister     OBJECTIVE:  Vitals:   04/09/21 1049  BP: (!) 170/80  Pulse: 81  Resp: 18  Temp: 98.8 F (37.1 C)  TempSrc: Oral  SpO2: 98%     General appearance: alert; appears fatigued, but nontoxic; speaking in full sentences and tolerating own secretions HEENT: NCAT; Ears: EACs clear, TMs pearly gray; Eyes: PERRL.  EOM grossly intact. Sinuses: nontender; Nose: nares patent without rhinorrhea, Throat: oropharynx clear, tonsils 1+non erythematous, uvula midline  Neck: supple without LAD Lungs: unlabored respirations, symmetrical air entry; cough: moderate; no respiratory distress; CTAB Heart: regular rate and rhythm.  Radial pulses 2+ symmetrical  bilaterally Skin: warm and dry Psychological: alert and cooperative; normal mood and affect  LABS:  No results found for this or any previous visit (from the past 24 hour(s)).   ASSESSMENT & PLAN:  1. Seasonal allergic reaction     Meds ordered this encounter  Medications  . benzonatate (TESSALON) 100 MG capsule    Sig: Take 1 capsule (100 mg total) by mouth 3 (three) times daily as needed for cough.    Dispense:  30 capsule    Refill:  0  . fexofenadine-pseudoephedrine (ALLEGRA-D) 60-120 MG 12 hr tablet    Sig: Take 1 tablet by mouth every 12 (twelve) hours.    Dispense:  30 tablet    Refill:  0   Patient is stable for discharge.  Symptom is likely from allergic reactions    Discharge instructions  Get plenty of rest and push fluids Tessalon Perles prescribed for cough Prescribed as prescribed seasonal allergy symptoms  use medications daily for symptom relief Use OTC medications like ibuprofen or tylenol as needed fever or pain Call or go to the ED if you have any new or worsening symptoms such as fever, worsening cough, shortness of breath, chest tightness, chest pain, turning blue, changes in mental status, etc...   Reviewed expectations re: course of current medical issues. Questions answered. Outlined signs and symptoms indicating need for more acute intervention. Patient verbalized understanding. After Visit Summary given.         Kathleen Parcel, FNP 04/09/21 1105

## 2022-05-14 ENCOUNTER — Ambulatory Visit (HOSPITAL_COMMUNITY)
Admission: EM | Admit: 2022-05-14 | Discharge: 2022-05-14 | Disposition: A | Discharge status: Home / Self Care | Payer: Medicare PPO | Attending: Emergency Medicine | Admitting: Emergency Medicine

## 2022-05-14 ENCOUNTER — Encounter (HOSPITAL_COMMUNITY): Payer: Self-pay

## 2022-05-14 DIAGNOSIS — J069 Acute upper respiratory infection, unspecified: Secondary | ICD-10-CM | POA: Diagnosis not present

## 2022-05-14 DIAGNOSIS — E785 Hyperlipidemia, unspecified: Secondary | ICD-10-CM | POA: Insufficient documentation

## 2022-05-14 DIAGNOSIS — R059 Cough, unspecified: Secondary | ICD-10-CM | POA: Insufficient documentation

## 2022-05-14 DIAGNOSIS — E119 Type 2 diabetes mellitus without complications: Secondary | ICD-10-CM | POA: Diagnosis not present

## 2022-05-14 DIAGNOSIS — Z20822 Contact with and (suspected) exposure to covid-19: Secondary | ICD-10-CM | POA: Insufficient documentation

## 2022-05-14 LAB — SARS CORONAVIRUS 2 (TAT 6-24 HRS): SARS Coronavirus 2: NEGATIVE

## 2022-05-14 NOTE — Discharge Instructions (Addendum)
Your symptoms today are most likely being caused by a virus and should steadily improve in time it can take up to 7 to 10 days before you truly start to see a turnaround however things will get better  Covid pending, you will be notified of positive results only, antivirals will be sent at that time     You can take Tylenol and/or Ibuprofen as needed for fever reduction and pain relief.   For cough: honey 1/2 to 1 teaspoon (you can dilute the honey in water or another fluid).  You can also use guaifenesin and dextromethorphan for cough. You can use a humidifier for chest congestion and cough.  If you don't have a humidifier, you can sit in the bathroom with the hot shower running.      For sore throat: try warm salt water gargles, cepacol lozenges, throat spray, warm tea or water with lemon/honey, popsicles or ice, or OTC cold relief medicine for throat discomfort.   For congestion: take a daily anti-histamine like Zyrtec, Claritin, and a oral decongestant, such as pseudoephedrine.  You can also use Flonase 1-2 sprays in each nostril daily.   It is important to stay hydrated: drink plenty of fluids (water, gatorade/powerade/pedialyte, juices, or teas) to keep your throat moisturized and help further relieve irritation/discomfort.

## 2022-05-14 NOTE — ED Provider Notes (Signed)
[facility]    CSN: [id number] Arrival date & time: 05/14/22  1155      History   Chief Complaint Chief Complaint  Patient presents with   Cough    HPI Kathleen Reid is a 66 y.o. female.   Patient presents with nasal congestion, bilateral ear fullness, rhinorrhea, itchy dry throat, nonproductive cough and intermittent generalized headaches for 2 days.  No known sick contacts.  Tolerating food and liquids.  Has attempted use of Tylenol, NyQuil and Benadryl which have been minimally effective.  Denies shortness of breath, wheezing, chest pain or tightness, fever, chills or body aches.  History of type 2 diabetes and hyperlipidemia.  Past Medical History:  Diagnosis Date   Diabetes mellitus without complication (HCC)    Eye disease     Patient Active Problem List   Diagnosis Date Noted   Upper airway cough syndrome 10/10/2019   Hyperlipidemia 11/05/2014   Type II or unspecified type diabetes mellitus without mention of complication, not stated as uncontrolled 07/15/2013    Past Surgical History:  Procedure Laterality Date   ABDOMINAL HYSTERECTOMY     KNEE SURGERY      OB History   No obstetric history on file.      Home Medications    Prior to Admission medications   Medication Sig Start Date End Date Taking? Authorizing Provider  ACCU-CHEK SMARTVIEW test strip TEST three times a day 04/11/16   Reather LittlerKumar, Ajay, MD  azelastine (ASTELIN) 0.1 % nasal spray Place 2 sprays into both nostrils 2 (two) times daily. 09/11/19   Marcelyn BruinsPadgett, Shaylar Patricia, MD  benzonatate (TESSALON) 100 MG capsule Take 1 capsule (100 mg total) by mouth 3 (three) times daily as needed for cough. 04/09/21   Avegno, Zachery DakinsKomlanvi S, FNP  clobetasol (TEMOVATE) 0.05 % external solution APP 1 ML EXT AA Q NIGHT 09/03/19   [provider]  doxycycline (MONODOX) 100 MG capsule TK 1 C PO BID WF 09/04/19   [provider]  famotidine (PEPCID) 20 MG tablet One after supper 10/10/19    Nyoka CowdenWert, Michael B, MD  FARXIGA 10 MG TABS tablet TK 1 T PO D 08/20/19   [provider]  fexofenadine-pseudoephedrine (ALLEGRA-D) 60-120 MG 12 hr tablet Take 1 tablet by mouth every 12 (twelve) hours. 04/09/21   Avegno, Zachery DakinsKomlanvi S, FNP  finasteride (PROSCAR) 5 MG tablet TK 1 T PO D 09/03/19   [provider]  Insulin Degludec (TRESIBA FLEXTOUCH) 200 UNIT/ML SOPN Inject into the skin See admin instructions.    [provider]  Insulin Pen Needle (BD PEN NEEDLE NANO U/F) 32G X 4 MM MISC Use one per day 03/15/16   Reather LittlerKumar, Ajay, MD  pantoprazole (PROTONIX) 40 MG tablet Take 1 tablet (40 mg total) by mouth daily. Take 30-60 min before first meal of the day 10/10/19   Nyoka CowdenWert, Michael B, MD  predniSONE (DELTASONE) 10 MG tablet Take  4 each am x 2 days,   2 each am x 2 days,  1 each am x 2 days and stop 10/10/19   Nyoka CowdenWert, Michael B, MD  valsartan (DIOVAN) 80 MG tablet Take 80 mg by mouth daily.    [provider]    Family History Family History  Problem Relation Age of Onset   Diabetes Mother    Heart disease Mother    Diabetes Father    Heart disease Father    Diabetes Sister    Heart disease Sister     Social  History Social History   Tobacco Use   Smoking status: Never   Smokeless tobacco: Never  Vaping Use   Vaping Use: Never used  Substance Use Topics   Alcohol use: No   Drug use: No     Allergies   Codeine   Review of Systems Review of Systems  Constitutional: Negative.   HENT:  Positive for congestion, ear pain and rhinorrhea. Negative for dental problem, drooling, ear discharge, facial swelling, hearing loss, mouth sores, nosebleeds, postnasal drip, sinus pressure, sinus pain, sneezing, sore throat, tinnitus, trouble swallowing and voice change.   Respiratory:  Positive for cough. Negative for apnea, choking, chest tightness, shortness of breath, wheezing and stridor.   Cardiovascular: Negative.   Gastrointestinal: Negative.   Skin: Negative.    Neurological:  Positive for headaches. Negative for dizziness, tremors, seizures, syncope, facial asymmetry, speech difficulty, weakness, light-headedness and numbness.     Physical Exam Triage Vital Signs ED Triage Vitals [05/14/22 1243]  Enc Vitals Group     BP (!) 150/92     Pulse Rate 95     Resp 20     Temp 98.6 F (37 C)     Temp Source Oral     SpO2 100 %     Weight      Height      Head Circumference      Peak Flow      Pain Score 6     Pain Loc      Pain Edu?      Excl. in GC?    No data found.  Updated Vital Signs BP (!) 150/92   Pulse 95   Temp 98.6 F (37 C) (Oral)   Resp 20   SpO2 100%   Visual Acuity Right Eye Distance:   Left Eye Distance:   Bilateral Distance:    Right Eye Near:   Left Eye Near:    Bilateral Near:     Physical Exam Constitutional:      Appearance: Normal appearance.  HENT:     Head: Normocephalic.     Right Ear: Tympanic membrane, ear canal and external ear normal.     Left Ear: Tympanic membrane, ear canal and external ear normal.     Nose: Congestion and rhinorrhea present.     Mouth/Throat:     Pharynx: Oropharynx is clear. No posterior oropharyngeal erythema.     Tonsils: No tonsillar exudate. 0 on the right. 0 on the left.  Eyes:     Extraocular Movements: Extraocular movements intact.  Cardiovascular:     Rate and Rhythm: Normal rate and regular rhythm.     Pulses: Normal pulses.     Heart sounds: Normal heart sounds.  Pulmonary:     Effort: Pulmonary effort is normal.     Breath sounds: Normal breath sounds.  Musculoskeletal:     Cervical back: Normal range of motion and neck supple.  Skin:    General: Skin is warm and dry.  Neurological:     Mental Status: She is alert and oriented to person, place, and time. Mental status is at baseline.  Psychiatric:        Mood and Affect: Mood normal.        Behavior: Behavior normal.      UC Treatments / Results  Labs (all labs ordered are listed, but only  abnormal results are displayed) Labs Reviewed - No data to display  EKG   Radiology No results found.  Procedures Procedures (  including critical care time)  Medications Ordered in UC Medications - No data to display  Initial Impression / Assessment and Plan / UC Course  I have reviewed the triage vital signs and the nursing notes.  Pertinent labs & imaging results that were available during my care of the patient were reviewed by me and considered in my medical decision making (see chart for details).  Viral URI with cough  Vital signs are stable, lungs are clear to auscultation, low suspicion for pneumonia, bronchitis or pneumothorax, will defer imaging, COVID test pending, antivirals may be ordered if positive, discussed administration, patient may continue use over-the-counter medications as needed for support, may follow-up with urgent care as needed for persisting or worsening Final Clinical Impressions(s) / UC Diagnoses   Final diagnoses:  None   Discharge Instructions   None    ED Prescriptions   None    PDMP not reviewed this encounter.   Valinda Hoar, NP 05/14/22 1327

## 2022-05-14 NOTE — ED Triage Notes (Signed)
Patient presents to Urgent Care with complaints of cough, ha, sneezing, sore throat, and congestion since Friday. Patient reports tylenol otc for pain.

## 2023-06-29 ENCOUNTER — Encounter (HOSPITAL_COMMUNITY): Payer: Self-pay

## 2023-06-29 ENCOUNTER — Ambulatory Visit (HOSPITAL_COMMUNITY)
Admission: EM | Admit: 2023-06-29 | Discharge: 2023-06-29 | Disposition: A | Discharge status: Home / Self Care | Payer: Medicare PPO | Source: Home / Self Care

## 2023-06-29 DIAGNOSIS — U071 COVID-19: Secondary | ICD-10-CM | POA: Diagnosis not present

## 2023-06-29 DIAGNOSIS — R519 Headache, unspecified: Secondary | ICD-10-CM | POA: Diagnosis present

## 2023-06-29 DIAGNOSIS — J069 Acute upper respiratory infection, unspecified: Secondary | ICD-10-CM | POA: Diagnosis not present

## 2023-06-29 DIAGNOSIS — J029 Acute pharyngitis, unspecified: Secondary | ICD-10-CM

## 2023-06-29 DIAGNOSIS — R509 Fever, unspecified: Secondary | ICD-10-CM | POA: Diagnosis present

## 2023-06-29 LAB — POCT INFLUENZA A/B
Influenza A, POC: NEGATIVE
Influenza B, POC: NEGATIVE

## 2023-06-29 LAB — POCT RAPID STREP A (OFFICE): Rapid Strep A Screen: NEGATIVE

## 2023-06-29 MED ORDER — ACETAMINOPHEN 325 MG PO TABS
975.0000 mg | ORAL_TABLET | Freq: Once | ORAL | Status: AC
  Administered 2023-06-29: 975 mg via ORAL

## 2023-06-29 MED ORDER — ACETAMINOPHEN 325 MG PO TABS
ORAL_TABLET | ORAL | Status: AC
  Filled 2023-06-29: qty 3

## 2023-06-29 NOTE — ED Provider Notes (Signed)
[facility]    CSN: [id number] Arrival date & time: 06/29/23  1325      History   Chief Complaint Chief Complaint  Patient presents with   Sore Throat    HPI Kathleen Reid is a 67 y.o. female.   Patient presents today with a 2-day history of URI symptoms.  Report sore throat, headache, fever, chills, rhinorrhea, postnasal drainage.  Denies any significant cough, chest pain, nausea, vomiting, diarrhea.  She has tried Tylenol, Excedrin, NyQuil, TheraFlu without improvement of symptoms.  She has not had any medications today.  She does have a history of diabetes but reports her blood sugars are adequately controlled.  Denies any history of chronic lung condition including asthma, COPD, smoking.  Denies any recent antibiotics or steroids.  She has never had COVID.  She has had COVID-19 vaccinations.  Denies any known sick contacts or recent travel.  She is having difficulty with daily duties as a result of her symptoms.    Past Medical History:  Diagnosis Date   Diabetes mellitus without complication (HCC)    Eye disease     Patient Active Problem List   Diagnosis Date Noted   Upper airway cough syndrome 10/10/2019   Hyperlipidemia 11/05/2014   Type II or unspecified type diabetes mellitus without mention of complication, not stated as uncontrolled 07/15/2013    Past Surgical History:  Procedure Laterality Date   ABDOMINAL HYSTERECTOMY     KNEE SURGERY      OB History   No obstetric history on file.      Home Medications    Prior to Admission medications   Medication Sig Start Date End Date Taking? Authorizing Provider  empagliflozin (JARDIANCE) 25 MG TABS tablet Take 25 mg by mouth daily.   Yes [provider]  hydrochlorothiazide (HYDRODIURIL) 12.5 MG tablet Take 12.5 mg by mouth daily.   Yes [provider]  tirzepatide [facility]) 2.5 MG/0.5ML Pen Inject 2.5 mg into the skin once a week.   Yes [provider]  ACCU-CHEK  SMARTVIEW test strip TEST three times a day 04/11/16   Reather Littler, MD  clobetasol (TEMOVATE) 0.05 % external solution APP 1 ML EXT AA Q NIGHT 09/03/19   [provider]  Insulin Pen Needle (BD PEN NEEDLE NANO U/F) 32G X 4 MM MISC Use one per day 03/15/16   Reather Littler, MD    Family History Family History  Problem Relation Age of Onset   Diabetes Mother    Heart disease Mother    Diabetes Father    Heart disease Father    Diabetes Sister    Heart disease Sister     Social History Social History   Tobacco Use   Smoking status: Never   Smokeless tobacco: Never  Vaping Use   Vaping status: Never Used  Substance Use Topics   Alcohol use: No   Drug use: No     Allergies   Codeine   Review of Systems Review of Systems  Constitutional:  Positive for activity change, fatigue and fever. Negative for appetite change.  HENT:  Positive for congestion, postnasal drip and sore throat. Negative for sneezing.   Respiratory:  Negative for cough and shortness of breath.   Cardiovascular:  Negative for chest pain.  Gastrointestinal:  Negative for abdominal pain, diarrhea, nausea and vomiting.  Neurological:  Positive for headaches. Negative for dizziness and light-headedness.     Physical Exam Triage Vital Signs ED Triage Vitals  Encounter Vitals  Group     BP 06/29/23 1539 (!) 155/84     Systolic BP Percentile --      Diastolic BP Percentile --      Pulse Rate 06/29/23 1539 (!) 104     Resp 06/29/23 1539 16     Temp 06/29/23 1539 (!) 101 F (38.3 C)     Temp Source 06/29/23 1539 Oral     SpO2 06/29/23 1539 92 %     Weight 06/29/23 1539 180 lb (81.6 kg)     Height 06/29/23 1539 5\' 5"  (1.651 m)     Head Circumference --      Peak Flow --      Pain Score 06/29/23 1540 7     Pain Loc --      Pain Education --      Exclude from Growth Chart --    No data found.  Updated Vital Signs BP (!) 151/83 (BP Location: Right Arm)   Pulse 100   Temp 100.3 F (37.9 C) (Oral)    Resp 16   Ht 5\' 5"  (1.651 m)   Wt 180 lb (81.6 kg)   SpO2 95%   BMI 29.95 kg/m   Visual Acuity Right Eye Distance:   Left Eye Distance:   Bilateral Distance:    Right Eye Near:   Left Eye Near:    Bilateral Near:     Physical Exam Vitals reviewed.  Constitutional:      General: She is awake. She is not in acute distress.    Appearance: Normal appearance. She is well-developed. She is not ill-appearing.     Comments: Very pleasant female appears stated age in no acute distress sitting comfortably in exam room  HENT:     Head: Normocephalic and atraumatic.     Right Ear: Tympanic membrane, ear canal and external ear normal. Tympanic membrane is not erythematous or bulging.     Left Ear: Tympanic membrane, ear canal and external ear normal. Tympanic membrane is not erythematous or bulging.     Nose:     Right Sinus: No maxillary sinus tenderness or frontal sinus tenderness.     Left Sinus: No maxillary sinus tenderness or frontal sinus tenderness.     Mouth/Throat:     Pharynx: Uvula midline. Posterior oropharyngeal erythema and postnasal drip present. No oropharyngeal exudate.  Cardiovascular:     Rate and Rhythm: Normal rate and regular rhythm.     Heart sounds: Normal heart sounds, S1 normal and S2 normal. No murmur heard. Pulmonary:     Effort: Pulmonary effort is normal.     Breath sounds: Normal breath sounds. No wheezing, rhonchi or rales.     Comments: Clear to auscultation bilaterally Psychiatric:        Behavior: Behavior is cooperative.      UC Treatments / Results  Labs (all labs ordered are listed, but only abnormal results are displayed) Labs Reviewed  SARS CORONAVIRUS 2 (TAT 6-24 HRS)  POCT RAPID STREP A (OFFICE)  POCT INFLUENZA A/B    EKG   Radiology No results found.  Procedures Procedures (including critical care time)  Medications Ordered in UC Medications  acetaminophen (TYLENOL) tablet 975 mg (975 mg Oral Given 06/29/23 1607)     Initial Impression / Assessment and Plan / UC Course  I have reviewed the triage vital signs and the nursing notes.  Pertinent labs & imaging results that were available during my care of the patient were reviewed by me and considered  in my medical decision making (see chart for details).     Patient was initially febrile and mildly tachycardic.  She was given Tylenol in clinic and her fever ultimately improved as did her heart rate.  X-ray was deferred as she had no adventitious lung sounds on exam.  Flu and strep testing were negative.  COVID test is pending.  We will contact her if she is positive for COVID.  Given her age and comorbidities she is a candidate for antiviral therapy but we do not have a recent kidney function on file.  Will consider molnupiravir if she is positive.  Recommend that she use over-the-counter medications including Tylenol and Mucinex for symptom relief.  She can also use nasal saline, sinus rinses, Flonase as needed.  She was encouraged to rest and drink plenty of fluid.  Recommended that she obtain a pulse oximeter at home and monitor her oxygen saturation.  If this drops below 90% she is to go to the emergency room.  She lives close to her sister and will have her sister check in on her.  We discussed that if any point her symptoms worsen or she is not feeling better by tomorrow she should go to the ER.  All questions were answered patient satisfaction.  Final Clinical Impressions(s) / UC Diagnoses   Final diagnoses:  Upper respiratory tract infection, unspecified type  Sore throat     Discharge Instructions      I am glad that you are feeling a little bit better after the Tylenol.  Continue taking over-the-counter medications as needed.  I will contact you if you are positive for COVID.  You tested negative for strep and flu today.  Obtain a pulse oximeter from the pharmacy and monitor your oxygen saturation.  If at any point this drops below 90% you need  to go to the emergency room.  If you are not feeling better tomorrow please go to the ER.  If anything worsens you should go to the ER sooner including shortness of breath, chest pain, nausea, vomiting, fever not responding to medication.     ED Prescriptions   None    PDMP not reviewed this encounter.   Jeani Hawking, PA-C 06/29/23 1710

## 2023-06-29 NOTE — ED Triage Notes (Signed)
Patient here today with c/o ST, headache, body aches, sweats, and runny nose X 2 days. She took Tylenol, Excedrin, Nyquil, Theraflu with minimal relief. No sick contacts. No recent travel.

## 2023-06-29 NOTE — Discharge Instructions (Signed)
I am glad that you are feeling a little bit better after the Tylenol.  Continue taking over-the-counter medications as needed.  I will contact you if you are positive for COVID.  You tested negative for strep and flu today.  Obtain a pulse oximeter from the pharmacy and monitor your oxygen saturation.  If at any point this drops below 90% you need to go to the emergency room.  If you are not feeling better tomorrow please go to the ER.  If anything worsens you should go to the ER sooner including shortness of breath, chest pain, nausea, vomiting, fever not responding to medication.

## 2023-06-30 ENCOUNTER — Telehealth: Payer: Self-pay | Admitting: Physician Assistant

## 2023-06-30 MED ORDER — MOLNUPIRAVIR EUA 200MG CAPSULE: 4.0000 | ORAL_CAPSULE | Freq: Two times a day (BID) | ORAL | 0 refills | Status: AC

## 2023-06-30 NOTE — Telephone Encounter (Signed)
Patient is positive for COVID.  She is a candidate for antiviral therapy.  Molnupiravir was sent to her pharmacy.  See result note for additional information.

## 2023-09-14 ENCOUNTER — Ambulatory Visit (HOSPITAL_COMMUNITY)
Admission: EM | Admit: 2023-09-14 | Discharge: 2023-09-14 | Disposition: A | Discharge status: Home / Self Care | Payer: Medicare PPO | Attending: Internal Medicine | Admitting: Internal Medicine

## 2023-09-14 ENCOUNTER — Encounter (HOSPITAL_COMMUNITY): Payer: Self-pay

## 2023-09-14 DIAGNOSIS — M545 Low back pain, unspecified: Secondary | ICD-10-CM

## 2023-09-14 DIAGNOSIS — R300 Dysuria: Secondary | ICD-10-CM

## 2023-09-14 DIAGNOSIS — E1169 Type 2 diabetes mellitus with other specified complication: Secondary | ICD-10-CM

## 2023-09-14 LAB — POCT URINALYSIS DIP (MANUAL ENTRY)
Bilirubin, UA: NEGATIVE
Blood, UA: NEGATIVE
Glucose, UA: >=1000 mg/dL — AB
Ketones, POC UA: NEGATIVE mg/dL
Leukocytes, UA: NEGATIVE
Nitrite, UA: POSITIVE — AB
Protein Ur, POC: NEGATIVE mg/dL
Spec Grav, UA: 1.02 (ref 1.010–1.025)
Urobilinogen, UA: 0.2 U/dL
pH, UA: 5 (ref 5.0–8.0)

## 2023-09-14 NOTE — ED Triage Notes (Addendum)
Patient c/o bilateral lower back pain and dysuria x 2 days.  Sats 90%. Patient states she has an appointment (09/26/23) to see her PCP regarding low sats.

## 2023-09-14 NOTE — ED Provider Notes (Signed)
[facility]    CSN: [id number] Arrival date & time: 09/14/23  1522      History   Chief Complaint Chief Complaint  Patient presents with   Dysuria   Back Pain    HPI Kathleen Reid is a 67 y.o. female.   Kathleen Reid is a 67 y.o. female presenting for chief complaint of low back pain for 2 days with associated dysuria and bladder pressure.  History of type 2 diabetes and takes SGLT2 inhibitor (Jardiance).  Denies urinary frequency, urgency, and hesitancy.  No gross hematuria, flank pain, fevers, chills, nausea, vomiting, abdominal pain, diarrhea, or recent trauma/injury to the low back.  No paresthesias to the lower extremities or extremity weakness.  She participates in physical activity regularly and did a kickboxing aquatic class last night without difficulty.  Blood sugars have been within normal limits recently.  Postprandial blood sugar 136 today after lunch.  Taking all medications as prescribed for type 2 diabetes and follows with PCP for management of this.  Denies frequent intake of urinary irritants.  Drinks mostly water.  She has not had use of any over-the-counter medications to help with symptoms PTA.   Dysuria Back Pain Associated symptoms: dysuria     Past Medical History:  Diagnosis Date   Diabetes mellitus without complication (HCC)    Eye disease     Patient Active Problem List   Diagnosis Date Noted   Upper airway cough syndrome 10/10/2019   Hyperlipidemia 11/05/2014   Type II or unspecified type diabetes mellitus without mention of complication, not stated as uncontrolled 07/15/2013    Past Surgical History:  Procedure Laterality Date   ABDOMINAL HYSTERECTOMY     KNEE SURGERY      OB History   No obstetric history on file.      Home Medications    Prior to Admission medications   Medication Sig Start Date End Date Taking? Authorizing Provider  ACCU-CHEK SMARTVIEW test strip TEST three times a day 04/11/16   Reather Littler,  MD  clobetasol (TEMOVATE) 0.05 % external solution APP 1 ML EXT AA Q NIGHT 09/03/19   [provider]  empagliflozin (JARDIANCE) 25 MG TABS tablet Take 25 mg by mouth daily.    [provider]  hydrochlorothiazide (HYDRODIURIL) 12.5 MG tablet Take 12.5 mg by mouth daily.    [provider]  Insulin Pen Needle (BD PEN NEEDLE NANO U/F) 32G X 4 MM MISC Use one per day 03/15/16   Reather Littler, MD  tirzepatide [facility]) 2.5 MG/0.5ML Pen Inject 2.5 mg into the skin once a week.    [provider]    Family History Family History  Problem Relation Age of Onset   Diabetes Mother    Heart disease Mother    Diabetes Father    Heart disease Father    Diabetes Sister    Heart disease Sister     Social History Social History   Tobacco Use   Smoking status: Never   Smokeless tobacco: Never  Vaping Use   Vaping status: Never Used  Substance Use Topics   Alcohol use: No   Drug use: No     Allergies   Codeine   Review of Systems Review of Systems  Genitourinary:  Positive for dysuria.  Musculoskeletal:  Positive for back pain.  Per HPI   Physical Exam Triage Vital Signs ED Triage Vitals  Encounter Vitals Group     BP 09/14/23 1534 136/68  Systolic BP Percentile --      Diastolic BP Percentile --      Pulse Rate 09/14/23 1534 87     Resp 09/14/23 1534 16     Temp 09/14/23 1534 99 F (37.2 C)     Temp Source 09/14/23 1534 Oral     SpO2 09/14/23 1534 90 %     Weight --      Height --      Head Circumference --      Peak Flow --      Pain Score 09/14/23 1537 7     Pain Loc --      Pain Education --      Exclude from Growth Chart --    No data found.  Updated Vital Signs BP 136/68 (BP Location: Right Arm)   Pulse 87   Temp 99 F (37.2 C) (Oral)   Resp 16   SpO2 90%   Visual Acuity Right Eye Distance:   Left Eye Distance:   Bilateral Distance:    Right Eye Near:   Left Eye Near:    Bilateral Near:     Physical  Exam Vitals and nursing note reviewed.  Constitutional:      Appearance: She is not ill-appearing or toxic-appearing.  HENT:     Head: Normocephalic and atraumatic.     Right Ear: Hearing and external ear normal.     Left Ear: Hearing and external ear normal.     Nose: Nose normal.     Mouth/Throat:     Lips: Pink.  Eyes:     General: Lids are normal. Vision grossly intact. Gaze aligned appropriately.     Extraocular Movements: Extraocular movements intact.     Conjunctiva/sclera: Conjunctivae normal.  Cardiovascular:     Rate and Rhythm: Normal rate and regular rhythm.     Heart sounds: Normal heart sounds, S1 normal and S2 normal.  Pulmonary:     Effort: Pulmonary effort is normal. No respiratory distress.     Breath sounds: Normal breath sounds and air entry.  Abdominal:     General: Bowel sounds are normal.     Palpations: Abdomen is soft.     Tenderness: There is no abdominal tenderness. There is no right CVA tenderness, left CVA tenderness or guarding.  Musculoskeletal:     Cervical back: Normal and neck supple.     Thoracic back: Normal.     Lumbar back: Tenderness present. No swelling, edema, deformity, signs of trauma, lacerations, spasms or bony tenderness. Normal range of motion. No scoliosis.       Back:     Comments: Diffuse tenderness to the midline L-spine and the bilateral paraspinous muscles.  Ambulatory with steady gait.  5/5 strength against resistance to bilateral lower extremities with dorsi flexion and plantarflexion.  Sensation intact distally.  Skin:    General: Skin is warm and dry.     Capillary Refill: Capillary refill takes less than 2 seconds.     Findings: No rash.  Neurological:     General: No focal deficit present.     Mental Status: She is alert and oriented to person, place, and time. Mental status is at baseline.     Cranial Nerves: No dysarthria or facial asymmetry.  Psychiatric:        Mood and Affect: Mood normal.        Speech: Speech  normal.        Behavior: Behavior normal.  Thought Content: Thought content normal.        Judgment: Judgment normal.      UC Treatments / Results  Labs (all labs ordered are listed, but only abnormal results are displayed) Labs Reviewed  POCT URINALYSIS DIP (MANUAL ENTRY) - Abnormal; Notable for the following components:      Result Value   Glucose, UA >=1,000 (*)    Nitrite, UA Positive (*)    All other components within normal limits  URINE CULTURE    EKG   Radiology No results found.  Procedures Procedures (including critical care time)  Medications Ordered in UC Medications - No data to display  Initial Impression / Assessment and Plan / UC Course  I have reviewed the triage vital signs and the nursing notes.  Pertinent labs & imaging results that were available during my care of the patient were reviewed by me and considered in my medical decision making (see chart for details).   1.  Dysuria, type 2 diabetes with other unspecified complication without long-term use of insulin, acute low back pain without sciatica Urinalysis shows glucosuria (takes SGLT2 inhibitor-expected) and is nitrite positive.  No leukocytes, however given SGLT2 inhibitor use, increased risk for urinary tract infection, and symptoms would like to culture urine and treat for acute cystitis based off of urine culture.  No signs of pyelonephritis/systemic signs of infection.  Encouraged to continue to stay well-hydrated and follow-up with PCP as needed.   Counseled patient on potential for adverse effects with medications prescribed/recommended today, strict ER and return-to-clinic precautions discussed, patient verbalized understanding.    Final Clinical Impressions(s) / UC Diagnoses   Final diagnoses:  Dysuria  Type 2 diabetes mellitus with other specified complication, without long-term current use of insulin (HCC)  Acute bilateral low back pain without sciatica     Discharge  Instructions      Your urine is unremarkable for signs of urinary tract infection, however given your symptoms I would like to send her urine for culture to make sure that it does not grow any bacteria.  Continue drinking plenty of fluids to stay well-hydrated.  Staff will call you if your urine culture shows any abnormalities.  Please schedule an appointment for follow-up with your primary care provider.     ED Prescriptions   None    PDMP not reviewed this encounter.   Carlisle Beers, [place] 09/14/23 1646

## 2023-09-14 NOTE — Discharge Instructions (Signed)
Your urine is unremarkable for signs of urinary tract infection, however given your symptoms I would like to send her urine for culture to make sure that it does not grow any bacteria.  Continue drinking plenty of fluids to stay well-hydrated.  Staff will call you if your urine culture shows any abnormalities.  Please schedule an appointment for follow-up with your primary care provider.

## 2023-09-16 ENCOUNTER — Telehealth: Payer: Self-pay

## 2023-09-16 LAB — URINE CULTURE: Culture: >=100000 — AB

## 2023-09-16 MED ORDER — SULFAMETHOXAZOLE-TRIMETHOPRIM 800-160 MG PO TABS: 1.0000 | ORAL_TABLET | Freq: Two times a day (BID) | ORAL | 0 refills | Status: AC

## 2023-09-16 NOTE — Telephone Encounter (Signed)
Per VO by L. Lequita Halt,  PA-C, "Bactrim DS BID x 5 days." Attempted to reach patient x1. LVM.  Rx sent to pharmacy on file.

## 2023-12-10 ENCOUNTER — Other Ambulatory Visit (HOSPITAL_COMMUNITY): Payer: Self-pay | Admitting: Internal Medicine

## 2023-12-10 DIAGNOSIS — R079 Chest pain, unspecified: Secondary | ICD-10-CM

## 2023-12-19 ENCOUNTER — Ambulatory Visit (HOSPITAL_COMMUNITY): Admission: RE | Admit: 2023-12-19 | Payer: Medicare PPO | Source: Ambulatory Visit

## 2023-12-24 ENCOUNTER — Telehealth (HOSPITAL_COMMUNITY): Payer: Self-pay | Admitting: *Deleted

## 2023-12-24 NOTE — Telephone Encounter (Signed)
Reaching out to patient to offer assistance regarding upcoming cardiac imaging study; pt verbalizes understanding of appt date/time, parking situation and where to check in, pre-test NPO status and medications ordered, and verified current allergies; name and call back number provided for further questions should they arise Hayley Sharpe RN Navigator Cardiac Imaging [facility] [phone] office [phone] cell  

## 2023-12-25 ENCOUNTER — Ambulatory Visit (HOSPITAL_COMMUNITY)
Admission: RE | Admit: 2023-12-25 | Discharge: 2023-12-25 | Disposition: A | Discharge status: Home / Self Care | Payer: Medicare PPO | Source: Ambulatory Visit | Attending: Internal Medicine | Admitting: Internal Medicine

## 2023-12-25 DIAGNOSIS — I2510 Atherosclerotic heart disease of native coronary artery without angina pectoris: Secondary | ICD-10-CM | POA: Diagnosis not present

## 2023-12-25 DIAGNOSIS — R079 Chest pain, unspecified: Secondary | ICD-10-CM | POA: Diagnosis present

## 2023-12-25 MED ORDER — NITROGLYCERIN 0.4 MG SL SUBL
SUBLINGUAL_TABLET | SUBLINGUAL | Status: AC
  Filled 2023-12-25: qty 2

## 2023-12-25 MED ORDER — IOHEXOL 350 MG/ML SOLN
95.0000 mL | Freq: Once | INTRAVENOUS | Status: AC | PRN
  Administered 2023-12-25: 95 mL via INTRAVENOUS

## 2023-12-25 MED ORDER — NITROGLYCERIN 0.4 MG SL SUBL
0.8000 mg | SUBLINGUAL_TABLET | Freq: Once | SUBLINGUAL | Status: AC
  Administered 2023-12-25: 0.8 mg via SUBLINGUAL

## 2023-12-26 ENCOUNTER — Other Ambulatory Visit: Payer: Self-pay | Admitting: Cardiology

## 2023-12-26 ENCOUNTER — Ambulatory Visit (HOSPITAL_COMMUNITY)
Admission: RE | Admit: 2023-12-26 | Discharge: 2023-12-26 | Disposition: A | Discharge status: Home / Self Care | Payer: Medicare PPO | Source: Ambulatory Visit | Attending: Cardiology | Admitting: Cardiology

## 2023-12-26 DIAGNOSIS — I2510 Atherosclerotic heart disease of native coronary artery without angina pectoris: Secondary | ICD-10-CM

## 2023-12-26 DIAGNOSIS — R931 Abnormal findings on diagnostic imaging of heart and coronary circulation: Secondary | ICD-10-CM

## 2023-12-26 LAB — POCT I-STAT CREATININE: Creatinine, Ser: 0.7 mg/dL (ref 0.44–1.00)

## 2024-02-15 ENCOUNTER — Other Ambulatory Visit: Payer: Self-pay | Admitting: Family Medicine

## 2024-02-15 DIAGNOSIS — Z1231 Encounter for screening mammogram for malignant neoplasm of breast: Secondary | ICD-10-CM

## 2024-03-05 ENCOUNTER — Other Ambulatory Visit: Payer: Self-pay | Admitting: Family Medicine

## 2024-03-05 DIAGNOSIS — Z780 Asymptomatic menopausal state: Secondary | ICD-10-CM

## 2024-03-15 ENCOUNTER — Other Ambulatory Visit: Payer: Self-pay

## 2024-03-15 ENCOUNTER — Ambulatory Visit (HOSPITAL_COMMUNITY)
Admission: EM | Admit: 2024-03-15 | Discharge: 2024-03-15 | Disposition: A | Discharge status: Home / Self Care | Attending: Emergency Medicine | Admitting: Emergency Medicine

## 2024-03-15 ENCOUNTER — Encounter (HOSPITAL_COMMUNITY): Payer: Self-pay | Admitting: *Deleted

## 2024-03-15 DIAGNOSIS — L03031 Cellulitis of right toe: Secondary | ICD-10-CM | POA: Diagnosis not present

## 2024-03-15 MED ORDER — CEPHALEXIN 500 MG PO CAPS: 500.0000 mg | ORAL_CAPSULE | Freq: Two times a day (BID) | ORAL | 0 refills | Status: AC

## 2024-03-15 NOTE — ED Provider Notes (Signed)
 [facility]    CSN: [id number] Arrival date & time: 03/15/24  1137      History   Chief Complaint Chief Complaint  Patient presents with   Toe Pain    HPI Kathleen Reid is a 68 y.o. female.   Patient presents to clinic for concern of pain and darkening to her fourth toe on her right foot.  Symptoms have been present for the past week or so.  She has not had any injury or trauma to the foot or toe.  Does not remember any bites or wounds.  She is a type II diabetic, but does not usually have issues with her feet.  She noticed the other day that her right fourth toe was darker than the others and throbbing.  There is a small scab on top of her toe. Has not had any fevers. Without swelling.    The history is provided by the patient and medical records.  Toe Pain    Past Medical History:  Diagnosis Date   Diabetes mellitus without complication (HCC)    Eye disease     Patient Active Problem List   Diagnosis Date Noted   Upper airway cough syndrome 10/10/2019   Hyperlipidemia 11/05/2014   Type II or unspecified type diabetes mellitus without mention of complication, not stated as uncontrolled 07/15/2013    Past Surgical History:  Procedure Laterality Date   ABDOMINAL HYSTERECTOMY     KNEE SURGERY      OB History   No obstetric history on file.      Home Medications    Prior to Admission medications   Medication Sig Start Date End Date Taking? Authorizing Provider  cephALEXin (KEFLEX) 500 MG capsule Take 1 capsule (500 mg total) by mouth 2 (two) times daily for 7 days. 03/15/24 03/22/24 Yes Sigmond Patalano  N, FNP  empagliflozin (JARDIANCE) 25 MG TABS tablet Take 25 mg by mouth daily.   Yes [provider]  tirzepatide Florence Hunt) 2.5 MG/0.5ML Pen Inject 2.5 mg into the skin once a week.   Yes [provider]  ACCU-CHEK SMARTVIEW test strip TEST three times a day 04/11/16   Lajean Pike, MD  clobetasol (TEMOVATE) 0.05 % external  solution APP 1 ML EXT AA Q NIGHT 09/03/19   [provider]  hydrochlorothiazide (HYDRODIURIL) 12.5 MG tablet Take 12.5 mg by mouth daily.    [provider]  Insulin Pen Needle (BD PEN NEEDLE NANO U/F) 32G X 4 MM MISC Use one per day 03/15/16   Lajean Pike, MD    Family History Family History  Problem Relation Age of Onset   Diabetes Mother    Heart disease Mother    Diabetes Father    Heart disease Father    Diabetes Sister    Heart disease Sister     Social History Social History   Tobacco Use   Smoking status: Never   Smokeless tobacco: Never  Vaping Use   Vaping status: Never Used  Substance Use Topics   Alcohol use: No   Drug use: No     Allergies   Codeine and Latex   Review of Systems Review of Systems  Per HPI  Physical Exam Triage Vital Signs ED Triage Vitals  Encounter Vitals Group     BP 03/15/24 1309 (!) 159/81     Systolic BP Percentile --      Diastolic BP Percentile --      Pulse Rate 03/15/24 1309 76  Resp 03/15/24 1309 20     Temp 03/15/24 1309 98.3 F (36.8 C)     Temp src --      SpO2 03/15/24 1309 97 %     Weight --      Height --      Head Circumference --      Peak Flow --      Pain Score 03/15/24 1303 10     Pain Loc --      Pain Education --      Exclude from Growth Chart --    No data found.  Updated Vital Signs BP (!) 159/81   Pulse 76   Temp 98.3 F (36.8 C)   Resp 20   SpO2 97%   Visual Acuity Right Eye Distance:   Left Eye Distance:   Bilateral Distance:    Right Eye Near:   Left Eye Near:    Bilateral Near:     Physical Exam Vitals and nursing note reviewed.  Constitutional:      Appearance: Normal appearance.  HENT:     Head: Normocephalic and atraumatic.     Right Ear: External ear normal.     Left Ear: External ear normal.     Nose: Nose normal.     Mouth/Throat:     Mouth: Mucous membranes are moist.  Eyes:     Conjunctiva/sclera: Conjunctivae normal.  Cardiovascular:      Rate and Rhythm: Normal rate.  Pulmonary:     Effort: Pulmonary effort is normal. No respiratory distress.  Musculoskeletal:        General: Normal range of motion.       Feet:  Skin:    General: Skin is warm and dry.     Capillary Refill: Capillary refill takes less than 2 seconds.  Neurological:     General: No focal deficit present.     Mental Status: She is alert.  Psychiatric:        Mood and Affect: Mood normal.      UC Treatments / Results  Labs (all labs ordered are listed, but only abnormal results are displayed) Labs Reviewed - No data to display  EKG   Radiology No results found.  Procedures Procedures (including critical care time)  Medications Ordered in UC Medications - No data to display  Initial Impression / Assessment and Plan / UC Course  I have reviewed the triage vital signs and the nursing notes.  Pertinent labs & imaging results that were available during my care of the patient were reviewed by me and considered in my medical decision making (see chart for details).  Vitals in triage reviewed, patient is hemodynamically stable. Has an area of ulceration w/hyperpigmentation to right 4th toe, concerning for cellulitis. Normal renal function on labs drawn 02/06/24. Started on Keflex, podiatry f/u encouraged. POC, f/u care and return precautions given, no questions at this time.      Final Clinical Impressions(s) / UC Diagnoses   Final diagnoses:  Cellulitis of toe of right foot     Discharge Instructions      Take the antibiotics twice daily with food for the next 7 days.  You can soak your foot twice daily with warm water and antibacterial solution such as Hibiclens or Epsom salt.  Afterwards pat the foot dry.  You should see some improvement over the next few days with the antibiotics, if no improvement or any changes please follow-up with podiatry.  It is a good idea to  be established with a podiatrist as a type II diabetic.  Return to  clinic for any new or urgent symptoms.    ED Prescriptions     Medication Sig Dispense Auth. Provider   cephALEXin (KEFLEX) 500 MG capsule Take 1 capsule (500 mg total) by mouth 2 (two) times daily for 7 days. 14 capsule Harlow Lighter, Zaidan Keeble  N, FNP      PDMP not reviewed this encounter.   Abner Ables, FNP 03/15/24 1343

## 2024-03-15 NOTE — Discharge Instructions (Signed)
 Take the antibiotics twice daily with food for the next 7 days.  You can soak your foot twice daily with warm water and antibacterial solution such as Hibiclens or Epsom salt.  Afterwards pat the foot dry.  You should see some improvement over the next few days with the antibiotics, if no improvement or any changes please follow-up with podiatry.  It is a good idea to be established with a podiatrist as a type II diabetic.  Return to clinic for any new or urgent symptoms.

## 2024-03-15 NOTE — ED Triage Notes (Signed)
 PT reports having pain to the RT 4th toe. Pt has a dark spot on the top of toe. Pt reports trying to fix it herself but now has sever pain to toe.

## 2024-03-15 NOTE — ED Triage Notes (Signed)
 PT reports she does not know what her meds and doses .

## 2024-04-08 ENCOUNTER — Encounter (HOSPITAL_BASED_OUTPATIENT_CLINIC_OR_DEPARTMENT_OTHER): Payer: Self-pay | Admitting: Radiology

## 2024-04-08 ENCOUNTER — Ambulatory Visit (HOSPITAL_BASED_OUTPATIENT_CLINIC_OR_DEPARTMENT_OTHER)
Admission: RE | Admit: 2024-04-08 | Discharge: 2024-04-08 | Disposition: A | Discharge status: Home / Self Care | Source: Ambulatory Visit | Attending: Family Medicine | Admitting: Family Medicine

## 2024-04-08 DIAGNOSIS — Z780 Asymptomatic menopausal state: Secondary | ICD-10-CM | POA: Insufficient documentation

## 2024-04-08 DIAGNOSIS — Z1231 Encounter for screening mammogram for malignant neoplasm of breast: Secondary | ICD-10-CM

## 2024-05-01 NOTE — Progress Notes (Unsigned)
 Cardiology Office Note:   Date:  05/09/2024  ID:  Kathleen Reid, DOB March 16, 1956, MRN [id number] PCP:  Errol Heaps, MD  [facility] Providers Cardiologist:  Alyssa Backbone, MD Referring MD: Errol Heaps, MD  Chief Complaint/Reason for Referral:  CAD ASSESSMENT:    1. Coronary artery disease involving native coronary artery of native heart without angina pectoris   2. Type 2 diabetes mellitus with complication, without long-term current use of insulin  (HCC)   3. Hypertension associated with diabetes (HCC)   4. Hyperlipidemia associated with type 2 diabetes mellitus (HCC)   5. Aortic atherosclerosis (HCC)   6. CKD stage 2 due to type 2 diabetes mellitus (HCC)   7. BMI 30.0-30.9,adult     PLAN:   In order of problems listed above: CAD: Status post PCI of mid LAD; continue DAPT until October 1 then stop aspirin and continue Plavix monotherapy only.  Start Crestor  20 mg.  Start as needed nitroglycerin .  Refer cardiac rehab. T2DM: Continue aspirin 81 mg, start Crestor  20 mg and due to CKD stage II will start losartan 25 mg daily.  Check BMP in 1 week Hypertension:   Will check echocardiogram.   Start losartan 25 mg daily due to CKD stage II.  Nurse blood pressure check in 1 week.  If blood pressures above 130/80 would increase losartan to 50 mg. Hyperlipidemia: Trial Crestor  20mg .  Check lipid panel, LFTs, LP(a) in 2 months; if Kathleen Reid cannot tolerate Crestor  we will refer to pharmacy for further recommendations. Aortic atherosclerosis: Continue aspirin 81 mg and start Crestor  20 mg. CKD stage II: Start losartan 25 mg for renal protection. Elevated BMI: Continue Mounjaro 2.5mg  qweekly.         Dispo:  Return in about 6 months (around 11/08/2024).      Medication Adjustments/Labs and Tests Ordered: Current medicines are reviewed at length with the patient today.  Concerns regarding medicines are outlined above.  The following changes have been made:     Labs/tests  ordered: Orders Placed This Encounter  Procedures   Lipoprotein A (LPA)   Hepatic function panel   Lipid panel   Basic metabolic panel with GFR   AMB referral to cardiac rehabilitation   EKG 12-Lead   ECHOCARDIOGRAM COMPLETE    Medication Changes: Meds ordered this encounter  Medications   rosuvastatin  (CRESTOR ) 20 MG tablet    Sig: Take 1 tablet (20 mg total) by mouth daily.    Dispense:  90 tablet    Refill:  3   losartan (COZAAR) 25 MG tablet    Sig: Take 1 tablet (25 mg total) by mouth daily.    Dispense:  90 tablet    Refill:  3   nitroGLYCERIN  (NITROSTAT ) 0.4 MG SL tablet    Sig: Place 1 tablet (0.4 mg total) under the tongue every 5 (five) minutes as needed.    Dispense:  25 tablet    Refill:  3    Current medicines are reviewed at length with the patient today.  The patient does not have concerns regarding medicines.     History of Present Illness:    FOCUSED PROBLEM LIST:   CAD  Angina >> mLAD DES  March 2025 ([facility]) Small second diagonal was jailed + borderline disease OM 3; LVEDP T2DM Not on insulin  Intolerant of Jardiance Hypertension Hyperlipidemia Intolerant atorvastatin  Aortic atherosclerosis Chest CT 2025 CKD stage II BMI 02 June 2024:  Patient consents to use of AI scribe. The patient is a  68 year old female with the above listed medical problems referred to establish cardiovascular care.  The patient had a coronary CTA done in January which demonstrated a high-grade mid LAD lesion.  This was done due to exertional angina.  Kathleen Reid underwent coronary angiography and PCI of her mid LAD in March 2025 at [facility].  Kathleen Reid is maintained on aspirin and Plavix.  Post-procedure, Kathleen Reid experiences occasional sharp, non-constant chest sensations described as 'a jolt or some type of little electrical thing' lasting a couple of seconds. No associated lightheadedness or syncope.  Kathleen Reid has a history of diabetes and was previously on Jardiance, which  led to infections and exacerbated knee joint pain. Kathleen Reid discontinued Jardiance and is currently on Mounjaro and glipizide. Kathleen Reid notes swelling in her ankles, legs, and arms, which is unusual for her. Her home blood pressure readings are mostly in the 140s, with occasional readings in the 130s.  Kathleen Reid experiences morning hand tightness that improves with movement, attributing it to arthritis. 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 Atorvastatin, taken for cholesterol, disrupts her sleep by causing coughing, and Kathleen Reid generally has poor sleep quality, leading to feelings of fatigue.  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      Current Medications: Current Meds  Medication Sig   ACCU-CHEK SMARTVIEW test strip TEST three times a day   clobetasol (TEMOVATE) 0.05 % external solution APP 1 ML EXT AA Q NIGHT   hydrochlorothiazide (HYDRODIURIL) 12.5 MG tablet Take 12.5 mg by mouth daily.   Insulin  Pen Needle (BD PEN NEEDLE NANO U/F) 32G X 4 MM MISC Use one per day   losartan (COZAAR) 25 MG tablet Take 1 tablet (25 mg total) by mouth daily.   nitroGLYCERIN  (NITROSTAT ) 0.4 MG SL tablet Place 1 tablet (0.4 mg total) under the tongue every 5 (five) minutes as needed.   rosuvastatin  (CRESTOR ) 20 MG tablet Take 1 tablet (20 mg total) by mouth daily.   tirzepatide (MOUNJARO) 2.5 MG/0.5ML Pen Inject 2.5 mg into the skin once a week.     Review of Systems:   Please see the history of present illness.    All other systems reviewed and are negative.     EKGs/Labs/Other Test Reviewed:   EKG:  2018 NSR  EKG Interpretation Date/Time:  Friday May 09 2024 14:02:27 EDT Ventricular Rate:  83 PR Interval:  216 QRS Duration:  94 QT Interval:  378 QTC Calculation: 444 R Axis:   13  Text Interpretation: Sinus rhythm with 1st degree A-V block When compared with ECG of 10-May-2017 22:16, No significant change was  found Confirmed by Alyssa Backbone ([id number]) on 05/09/2024 2:17:16 PM         Risk Assessment/Calculations:          Physical Exam:   VS:  BP (!) 140/80   Pulse 83   Ht 5\' 5"  (1.651 m)   Wt 189 lb (85.7 kg)   SpO2 96%   BMI 31.45 kg/m    HYPERTENSION CONTROL Vitals:   05/09/24 1354 05/09/24 1434  BP: (!) 158/64 (!) 140/80    The patient's blood pressure is elevated above target today.  In order to address the patient's elevated BP: A new medication was prescribed today.      Wt Readings from Last 3 Encounters:  05/09/24 189 lb (85.7 kg)  06/29/23 180 lb (81.6 kg)  10/10/19 189 lb (85.7 kg)  GENERAL:  No apparent distress, AOx3 HEENT:  No carotid bruits, +2 carotid impulses, no scleral icterus CAR: RRR no murmurs, gallops, rubs, or thrills RES:  Clear to auscultation bilaterally ABD:  Soft, nontender, nondistended, positive bowel sounds x 4 VASC:  +2 radial pulses, +2 carotid pulses NEURO:  CN 2-12 grossly intact; motor and sensory grossly intact PSYCH:  No active depression or anxiety EXT:  No edema, ecchymosis, or cyanosis  Signed, Jasmond River K Jalise Zawistowski, MD  05/09/2024 2:48 PM    [facility] [address] [place], [place], [place]  [address] Phone: [phone]; Fax: [phone]   Note:  This document was prepared using Dragon voice recognition software and may include unintentional dictation errors.

## 2024-05-08 ENCOUNTER — Ambulatory Visit: Admitting: Internal Medicine

## 2024-05-09 ENCOUNTER — Ambulatory Visit: Attending: Internal Medicine | Admitting: Internal Medicine

## 2024-05-09 ENCOUNTER — Encounter: Payer: Self-pay | Admitting: Internal Medicine

## 2024-05-09 ENCOUNTER — Other Ambulatory Visit (HOSPITAL_COMMUNITY): Payer: Self-pay

## 2024-05-09 VITALS — BP 140/80 | HR 83 | Ht 65.0 in | Wt 189.0 lb

## 2024-05-09 DIAGNOSIS — E118 Type 2 diabetes mellitus with unspecified complications: Secondary | ICD-10-CM

## 2024-05-09 DIAGNOSIS — E1169 Type 2 diabetes mellitus with other specified complication: Secondary | ICD-10-CM

## 2024-05-09 DIAGNOSIS — E1159 Type 2 diabetes mellitus with other circulatory complications: Secondary | ICD-10-CM | POA: Diagnosis not present

## 2024-05-09 DIAGNOSIS — I700 Atherosclerosis of aorta: Secondary | ICD-10-CM

## 2024-05-09 DIAGNOSIS — N182 Chronic kidney disease, stage 2 (mild): Secondary | ICD-10-CM

## 2024-05-09 DIAGNOSIS — I152 Hypertension secondary to endocrine disorders: Secondary | ICD-10-CM

## 2024-05-09 DIAGNOSIS — I2510 Atherosclerotic heart disease of native coronary artery without angina pectoris: Secondary | ICD-10-CM

## 2024-05-09 DIAGNOSIS — Z6830 Body mass index (BMI) 30.0-30.9, adult: Secondary | ICD-10-CM

## 2024-05-09 DIAGNOSIS — E785 Hyperlipidemia, unspecified: Secondary | ICD-10-CM

## 2024-05-09 DIAGNOSIS — E1122 Type 2 diabetes mellitus with diabetic chronic kidney disease: Secondary | ICD-10-CM

## 2024-05-09 MED ORDER — NITROGLYCERIN 0.4 MG SL SUBL: 0.4000 mg | SUBLINGUAL_TABLET | SUBLINGUAL | 3 refills | Status: DC | PRN

## 2024-05-09 MED ORDER — ROSUVASTATIN CALCIUM 20 MG PO TABS: 20.0000 mg | ORAL_TABLET | Freq: Every day | ORAL | 3 refills | Status: DC

## 2024-05-09 MED ORDER — NITROGLYCERIN 0.4 MG SL SUBL: 0.4000 mg | SUBLINGUAL_TABLET | SUBLINGUAL | 3 refills | Status: AC | PRN

## 2024-05-09 MED ORDER — LOSARTAN POTASSIUM 25 MG PO TABS: 25.0000 mg | ORAL_TABLET | Freq: Every day | ORAL | 3 refills | Status: DC

## 2024-05-09 NOTE — Patient Instructions (Signed)
 Medication Instructions:  START Losartan 25 mg daily  START Crestor  20 mg daily   START AS NEEDED FOR CHEST PAIN: Nitroglycerin   *If you need a refill on your cardiac medications before your next appointment, please call your pharmacy*  Lab Work:  Lipid Panel, Hepatic panel, Lpa in 2 months  Bmp in 1 week  If you have labs (blood work) drawn today and your tests are completely normal, you will receive your results only by: MyChart Message (if you have MyChart) OR A paper copy in the mail If you have any lab test that is abnormal or we need to change your treatment, we will call you to review the results.  Testing/Procedures:  ECHO  Your physician has requested that you have an echocardiogram. Echocardiography is a painless test that uses sound waves to create images of your heart. It provides your doctor with information about the size and shape of your heart and how well your heart's chambers and valves are working. This procedure takes approximately one hour. There are no restrictions for this procedure. Please do NOT wear cologne, perfume, aftershave, or lotions (deodorant is allowed). Please arrive 15 minutes prior to your appointment time.  Please note: We ask at that you not bring children with you during ultrasound (echo/ vascular) testing. Due to room size and safety concerns, children are not allowed in the ultrasound rooms during exams. Our front office staff cannot provide observation of children in our lobby area while testing is being conducted. An adult accompanying a patient to their appointment will only be allowed in the ultrasound room at the discretion of the ultrasound technician under special circumstances. We apologize for any inconvenience.   REFERRED FOR CARDIAC REHAB    Follow-Up: At [facility], you and your health needs are our priority.  As part of our continuing mission to provide you with exceptional heart care, our providers are all part of  one team.  This team includes your primary Cardiologist (physician) and Advanced Practice Providers or APPs (Physician Assistants and Nurse Practitioners) who all work together to provide you with the care you need, when you need it.  NEED NURSE VISIT FOR BLOOD PRESSURE CHECK IN 7 TO 10 DAYS  Your next appointment:   6 month(s)  Provider:   Dr. Lorie Rook

## 2024-05-09 NOTE — Addendum Note (Signed)
 Addended by: Sairah Knobloch on: 05/09/2024 03:28 PM   Modules accepted: Orders

## 2024-05-14 ENCOUNTER — Telehealth (HOSPITAL_COMMUNITY): Payer: Self-pay

## 2024-05-14 ENCOUNTER — Telehealth: Payer: Self-pay | Admitting: Pharmacy Technician

## 2024-05-14 ENCOUNTER — Other Ambulatory Visit (HOSPITAL_COMMUNITY): Payer: Self-pay

## 2024-05-14 NOTE — Telephone Encounter (Signed)
 Attempted to call patient in regards to Cardiac Rehab - LM on VM

## 2024-05-14 NOTE — Telephone Encounter (Signed)
 Pharmacy Patient Advocate Encounter   Received notification from CoverMyMeds that prior authorization for rosuvastatin  is required/requested.   Insurance verification completed.   The patient is insured through [place] .   Per test claim: The current 05/14/24 day co-pay is, $0.00 3 months .  No PA needed at this time. This test claim was processed through [facility]- copay amounts may vary at other pharmacies due to pharmacy/plan contracts, or as the patient moves through the different stages of their insurance plan.

## 2024-05-14 NOTE — Telephone Encounter (Signed)
 Pt insurance is active and benefits verified through [facility] Co-pay $20, DED 0/0 met, out of pocket $4,000/$781.54 met, co-insurance 0%. no pre-authorization required. Passport, 05/14/2024@11 :12, REF# [id number]  How many CR sessions are covered? (ICR)72 Is this a lifetime maximum or an annual maximum? annual Has the member used any of these services to date? no Is there a time limit (weeks/months) on start of program and/or program completion? no   Will contact patient to see if he is interested in the Cardiac Rehab Program. If interested, patient will need to complete follow up appt. Once completed, patient will be contacted for scheduling upon review by the RN Navigator.

## 2024-05-21 ENCOUNTER — Telehealth (HOSPITAL_COMMUNITY): Payer: Self-pay

## 2024-05-21 NOTE — Telephone Encounter (Signed)
 Called patient to see if she was interested in participating in the Cardiac Rehab Program. Patient will come in for orientation on 7/01 and will attend the 8:15 exercise class, will start 7/09.  Sent MyChart message.

## 2024-05-28 ENCOUNTER — Telehealth: Payer: Self-pay | Admitting: Internal Medicine

## 2024-05-28 NOTE — Telephone Encounter (Signed)
 Patient identification verified by 2 forms. Shirl Ellen, RN     Patient called and scheduled for nurse visit next week.   Patient verbalized understanding. No questions at this time.

## 2024-05-28 NOTE — Telephone Encounter (Signed)
  Patient was given some medications at the hospital when she had her stent put in. She needs them refilled but they are not on her med list.   Clopidogrel 75 mg tablets take 1 tablet by mouth daily Isosordide Mononitrate 30 mg ER tablets take 1 tablet by mouth daily  Patient would like to see if we can fill these for her. If so, can we send to  [id number] - [place], [place] - [address]

## 2024-05-28 NOTE — Telephone Encounter (Signed)
  Per last AVS patient needed to get a nurse visit scheduled. Please give her a call to schedule as the scheduling team is not to schedule those.

## 2024-05-29 MED ORDER — CLOPIDOGREL BISULFATE 75 MG PO TABS: 75.0000 mg | ORAL_TABLET | Freq: Every day | ORAL | 3 refills | Status: AC

## 2024-05-29 MED ORDER — ISOSORBIDE MONONITRATE ER 30 MG PO TB24: 30.0000 mg | ORAL_TABLET | Freq: Every day | ORAL | 3 refills | Status: DC

## 2024-05-29 NOTE — Telephone Encounter (Signed)
Spoke with pt, Refill sent to the pharmacy electronically.  

## 2024-06-03 ENCOUNTER — Ambulatory Visit: Attending: Cardiology | Admitting: *Deleted

## 2024-06-03 ENCOUNTER — Encounter (HOSPITAL_COMMUNITY)
Admission: RE | Admit: 2024-06-03 | Discharge: 2024-06-03 | Disposition: A | Discharge status: Home / Self Care | Source: Ambulatory Visit | Attending: Internal Medicine | Admitting: Internal Medicine

## 2024-06-03 VITALS — BP 122/60 | HR 73 | Ht 66.5 in | Wt 195.8 lb

## 2024-06-03 VITALS — BP 128/56 | HR 74

## 2024-06-03 DIAGNOSIS — Z955 Presence of coronary angioplasty implant and graft: Secondary | ICD-10-CM | POA: Insufficient documentation

## 2024-06-03 DIAGNOSIS — I10 Essential (primary) hypertension: Secondary | ICD-10-CM | POA: Diagnosis not present

## 2024-06-03 NOTE — Progress Notes (Signed)
 Cardiac Rehab Medication Review   Does the patient  feel that his/her medications are working for him/her?  yes  Has the patient been experiencing any side effects to the medications prescribed?  yes  Does the patient measure his/her own blood pressure or blood glucose at home?  yes   Does the patient have any problems obtaining medications due to transportation or finances?   no  Understanding of regimen: excellent Understanding of indications: excellent Potential of compliance: excellent    Comments: Pt voices that she can get an upset stomach after taking her medications, or feeling fatigued after taking them. Pt does not consistently eat breakfast. Pt does check blood pressures and blood sugars BID. Pt has no questions regarding her medications.    Alm Parkins 06/03/2024 9:23 AM

## 2024-06-03 NOTE — Progress Notes (Signed)
   Nurse Visit   Date of Encounter: 06/03/2024 ID: Kathleen Reid, DOB Jan 28, 1956, MRN [id number]  PCP:  [name], MD   [facility] Providers Cardiologist:  Lurena MARLA Red, MD      Visit Details   VS:  BP (!) 128/56 (BP Location: Left Arm, Patient Position: Sitting, Cuff Size: Normal)   Pulse 74   SpO2 94%  , BMI There is no height or weight on file to calculate BMI.  Wt Readings from Last 3 Encounters:  06/03/24 195 lb 12.3 oz (88.8 kg)  05/09/24 189 lb (85.7 kg)  06/29/23 180 lb (81.6 kg)     Reason for visit: BP check after starting Losartan  25 mg every day Performed today: Vitals, Provider consulted:Dr. Ladona (DOD 1), and Education Changes (medications, testing, etc.) : No Changes; patient has current script for Losartan  (90D/3 Refills) prescribed 05/09/24 Length of Visit: 10 minutes    Medications Adjustments/Labs and Tests Ordered: No orders of the defined types were placed in this encounter.  No orders of the defined types were placed in this encounter.    Signed, Zara Buel Fret, RN  06/03/2024 2:24 PM

## 2024-06-03 NOTE — Progress Notes (Signed)
 Cardiac Individual Treatment Plan  Patient Details  Name: Kathleen Reid MRN: [id number] Date of Birth: 03/15/1956 Referring Provider:   Flowsheet Row INTENSIVE CARDIAC REHAB ORIENT from 06/03/2024 in [facility]  Referring Provider Lurena Red, MD    Initial Encounter Date:  Flowsheet Row INTENSIVE CARDIAC REHAB ORIENT from 06/03/2024 in [facility]  Date 06/03/24    Visit Diagnosis: 02/26/24 Status post coronary artery stent placement  Patient's Home Medications on Admission:  Current Outpatient Medications:    ACCU-CHEK SMARTVIEW test strip, TEST three times a day, Disp: 100 each, Rfl: 2   aspirin 81 MG chewable tablet, Chew 81 mg by mouth daily., Disp: , Rfl:    clobetasol (TEMOVATE) 0.05 % external solution, APP 1 ML EXT AA Q NIGHT, Disp: , Rfl:    clopidogrel  (PLAVIX ) 75 MG tablet, Take 1 tablet (75 mg total) by mouth daily., Disp: 90 tablet, Rfl: 3   ergocalciferol (VITAMIN D2) 1.25 MG (50000 UT) capsule, Take 50,000 Units by mouth once a week., Disp: , Rfl:    Insulin  Pen Needle (BD PEN NEEDLE NANO U/F) 32G X 4 MM MISC, Use one per day, Disp: 30 each, Rfl: 5   isosorbide  mononitrate (IMDUR ) 30 MG 24 hr tablet, Take 1 tablet (30 mg total) by mouth daily., Disp: 90 tablet, Rfl: 3   losartan  (COZAAR ) 25 MG tablet, Take 1 tablet (25 mg total) by mouth daily., Disp: 90 tablet, Rfl: 3   nitroGLYCERIN  (NITROSTAT ) 0.4 MG SL tablet, Place 1 tablet (0.4 mg total) under the tongue every 5 (five) minutes as needed., Disp: 25 tablet, Rfl: 3   rosuvastatin  (CRESTOR ) 20 MG tablet, Take 1 tablet (20 mg total) by mouth daily., Disp: 90 tablet, Rfl: 3   tirzepatide (MOUNJARO) 2.5 MG/0.5ML Pen, Inject 2.5 mg into the skin once a week. (Patient taking differently: Inject 7.5 mg into the skin once a week.), Disp: , Rfl:    hydrochlorothiazide (HYDRODIURIL) 12.5 MG tablet, Take 12.5  mg by mouth daily., Disp: , Rfl:   Past Medical History: Past Medical History:  Diagnosis Date   Diabetes mellitus without complication (HCC)    Eye disease    HLD (hyperlipidemia)     Tobacco Use: Social History   Tobacco Use  Smoking Status Never  Smokeless Tobacco Never    Labs: Review Flowsheet  More data exists      Latest Ref Rng & Units 10/08/2014 05/17/2015 02/25/2016 09/11/2016 10/16/2016  Labs for ITP Cardiac and Pulmonary Rehab  Cholestrol 0 - 200 mg/dL - - - - 801   LDL (calc) 0 - 99 mg/dL - - - - 870   HDL-C >60.99 mg/dL - - - - 60.39   Trlycerides 0.0 - 149.0 mg/dL - - - - 856.9   Hemoglobin A1c - 7.8  6.7  9.8  10.7  -    Capillary Blood Glucose: Lab Results  Component Value Date   GLUCAP 286 (H) 04/10/2011   GLUCAP 304 (H) 12/23/2010     Exercise Target Goals: Exercise Program Goal: Individual exercise prescription set using results from initial 6 min walk test and THRR while considering  patient's activity barriers and safety.   Exercise Prescription Goal: Initial exercise prescription builds to 30-45 minutes a day of aerobic activity, 2-3 days per week.  Home exercise guidelines will be given to patient during program as part of exercise prescription that the participant  will acknowledge.  Activity Barriers & Risk Stratification:  Activity Barriers & Cardiac Risk Stratification - 06/03/24 1125       Activity Barriers & Cardiac Risk Stratification   Activity Barriers Arthritis;Balance Concerns;History of Falls;Joint Problems;Other (comment)    Comments Pt has depth perception issues.    Cardiac Risk Stratification High          6 Minute Walk:  6 Minute Walk     Row Name 06/03/24 1050         6 Minute Walk   Phase Initial     Distance 1136 feet     Walk Time 6 minutes     # of Rest Breaks 0     MPH 2.15     METS 2.6     RPE 10     Perceived Dyspnea  0     VO2 Peak 9.07     Symptoms No     Resting HR 71 bpm     Resting BP 122/60      Resting Oxygen Saturation  97 %     Exercise Oxygen Saturation  during 6 min walk 95 %     Max Ex. HR 90 bpm     Max Ex. BP 150/64     2 Minute Post BP 124/62        Oxygen Initial Assessment:   Oxygen Re-Evaluation:   Oxygen Discharge (Final Oxygen Re-Evaluation):   Initial Exercise Prescription:  Initial Exercise Prescription - 06/03/24 1100       Date of Initial Exercise RX and Referring Provider   Date 06/03/24    Referring Provider Lurena Red, MD    Expected Discharge Date 08/27/24      Recumbant Bike   Level 1    RPM 60    Watts 17    Minutes 15    METs 2.6      NuStep   Level 2    SPM 75    Minutes 15    METs 2.6      Prescription Details   Frequency (times per week) 2   Pt will attend M/W   Duration Progress to 30 minutes of continuous aerobic without signs/symptoms of physical distress      Intensity   THRR 40-80% of Max Heartrate 61-122    Ratings of Perceived Exertion 11-13    Perceived Dyspnea 0-4      Progression   Progression Continue progressive overload as per policy without signs/symptoms or physical distress.      Resistance Training   Training Prescription Yes    Weight 3 lb    Reps 10-15          Perform Capillary Blood Glucose checks as needed.  Exercise Prescription Changes:   Exercise Comments:   Exercise Goals and Review:   Exercise Goals     Row Name 06/03/24 1011             Exercise Goals   Increase Physical Activity Yes       Intervention Provide advice, education, support and counseling about physical activity/exercise needs.;Develop an individualized exercise prescription for aerobic and resistive training based on initial evaluation findings, risk stratification, comorbidities and participant's personal goals.       Expected Outcomes Short Term: Attend rehab on a regular basis to increase amount of physical activity.;Long Term: Add in home exercise to make exercise part of routine and to increase  amount of physical activity.;Long Term: Exercising regularly at least 3-5 days  a week.       Increase Strength and Stamina Yes       Intervention Provide advice, education, support and counseling about physical activity/exercise needs.;Develop an individualized exercise prescription for aerobic and resistive training based on initial evaluation findings, risk stratification, comorbidities and participant's personal goals.       Expected Outcomes Short Term: Increase workloads from initial exercise prescription for resistance, speed, and METs.;Short Term: Perform resistance training exercises routinely during rehab and add in resistance training at home;Long Term: Improve cardiorespiratory fitness, muscular endurance and strength as measured by increased METs and functional capacity ( )       Able to understand and use rate of perceived exertion (RPE) scale Yes       Intervention Provide education and explanation on how to use RPE scale       Expected Outcomes Short Term: Able to use RPE daily in rehab to express subjective intensity level;Long Term:  Able to use RPE to guide intensity level when exercising independently       Knowledge and understanding of Target Heart Rate Range (THRR) Yes       Intervention Provide education and explanation of THRR including how the numbers were predicted and where they are located for reference       Expected Outcomes Short Term: Able to state/look up THRR;Long Term: Able to use THRR to govern intensity when exercising independently;Short Term: Able to use daily as guideline for intensity in rehab       Understanding of Exercise Prescription Yes       Intervention Provide education, explanation, and written materials on patient's individual exercise prescription       Expected Outcomes Short Term: Able to explain program exercise prescription;Long Term: Able to explain home exercise prescription to exercise independently          Exercise Goals Re-Evaluation  :   Discharge Exercise Prescription (Final Exercise Prescription Changes):   Nutrition:  Target Goals: Understanding of nutrition guidelines, daily intake of sodium 1500mg , cholesterol 200mg , calories 30% from fat and 7% or less from saturated fats, daily to have 5 or more servings of fruits and vegetables.  Biometrics:  Pre Biometrics - 06/03/24 0948       Pre Biometrics   Waist Circumference 40 inches    Hip Circumference 41.2 inches    Waist to Hip Ratio 0.97 %    Triceps Skinfold 20 mm    % Body Fat 40.3 %    Grip Strength 34 kg    Flexibility 17.5 in    Single Leg Stand 3.5 seconds           Nutrition Therapy Plan and Nutrition Goals:   Nutrition Assessments:  MEDIFICTS Score Key: >=70 Need to make dietary changes  40-70 Heart Healthy Diet <= 40 Therapeutic Level Cholesterol Diet    Picture Your Plate Scores: <59 Unhealthy dietary pattern with much room for improvement. 41-50 Dietary pattern unlikely to meet recommendations for good health and room for improvement. 51-60 More healthful dietary pattern, with some room for improvement.  >60 Healthy dietary pattern, although there may be some specific behaviors that could be improved.    Nutrition Goals Re-Evaluation:   Nutrition Goals Re-Evaluation:   Nutrition Goals Discharge (Final Nutrition Goals Re-Evaluation):   Psychosocial: Target Goals: Acknowledge presence or absence of significant depression and/or stress, maximize coping skills, provide positive support system. Participant is able to verbalize types and ability to use techniques and skills needed for reducing stress and  depression.  Initial Review & Psychosocial Screening:  Initial Psych Review & Screening - 06/03/24 1012       Initial Review   Current issues with Current Depression;Current Sleep Concerns      Family Dynamics   Good Support System? Yes   Pt has daughters, sisters, brothers, extended family     Barriers    Psychosocial barriers to participate in program The patient should benefit from training in stress management and relaxation.      Screening Interventions   Interventions Encouraged to exercise    Expected Outcomes Short Term goal: Utilizing psychosocial counselor, staff and physician to assist with identification of specific Stressors or current issues interfering with healing process. Setting desired goal for each stressor or current issue identified.;Long Term Goal: Stressors or current issues are controlled or eliminated.;Short Term goal: Identification and review with participant of any Quality of Life or Depression concerns found by scoring the questionnaire.;Long Term goal: The participant improves quality of Life and PHQ9 Scores as seen by post scores and/or verbalization of changes          Quality of Life Scores:  Quality of Life - 06/03/24 1120       Quality of Life   Select Quality of Life      Quality of Life Scores   Health/Function Pre 15 %    Socioeconomic Pre 23.79 %    Psych/Spiritual Pre 19.64 %    Family Pre 12 %    GLOBAL Pre 17.74 %         Scores of 19 and below usually indicate a poorer quality of life in these areas.  A difference of  2-3 points is a clinically meaningful difference.  A difference of 2-3 points in the total score of the Quality of Life Index has been associated with significant improvement in overall quality of life, self-image, physical symptoms, and general health in studies assessing change in quality of life.  PHQ-9: Review Flowsheet       06/03/2024 12/24/2013  Depression screen PHQ 2/9  Decreased Interest 0 0  Down, Depressed, Hopeless 1 0  PHQ - 2 Score 1 0  Altered sleeping 3 -  Tired, decreased energy 1 -  Change in appetite 0 -  Feeling bad or failure about yourself  2 -  Trouble concentrating 0 -  Moving slowly or fidgety/restless 0 -  Suicidal thoughts 0 -  PHQ-9 Score 7 -  Difficult doing work/chores Not difficult at  all -   Interpretation of Total Score  Total Score Depression Severity:  1-4 = Minimal depression, 5-9 = Mild depression, 10-14 = Moderate depression, 15-19 = Moderately severe depression, 20-27 = Severe depression   Psychosocial Evaluation and Intervention:   Psychosocial Re-Evaluation:   Psychosocial Discharge (Final Psychosocial Re-Evaluation):   Vocational Rehabilitation: Provide vocational rehab assistance to qualifying candidates.   Vocational Rehab Evaluation & Intervention:  Vocational Rehab - 06/03/24 1013       Initial Vocational Rehab Evaluation & Intervention   Assessment shows need for Vocational Rehabilitation No   Pt is retired         Education: Education Goals: Education classes will be provided on a weekly basis, covering required topics. Participant will state understanding/return demonstration of topics presented.     Core Videos: Exercise    Move It!  Clinical staff conducted group or individual video education with verbal and written material and guidebook.  Patient learns the recommended Pritikin exercise program. Exercise with the goal  of living a long, healthy life. Some of the health benefits of exercise include controlled diabetes, healthier blood pressure levels, improved cholesterol levels, improved heart and lung capacity, improved sleep, and better body composition. Everyone should speak with their doctor before starting or changing an exercise routine.  Biomechanical Limitations Clinical staff conducted group or individual video education with verbal and written material and guidebook.  Patient learns how biomechanical limitations can impact exercise and how we can mitigate and possibly overcome limitations to have an impactful and balanced exercise routine.  Body Composition Clinical staff conducted group or individual video education with verbal and written material and guidebook.  Patient learns that body composition (ratio of muscle  mass to fat mass) is a key component to assessing overall fitness, rather than body weight alone. Increased fat mass, especially visceral belly fat, can put us  at increased risk for metabolic syndrome, type 2 diabetes, heart disease, and even death. It is recommended to combine diet and exercise (cardiovascular and resistance training) to improve your body composition. Seek guidance from your physician and exercise physiologist before implementing an exercise routine.  Exercise Action Plan Clinical staff conducted group or individual video education with verbal and written material and guidebook.  Patient learns the recommended strategies to achieve and enjoy long-term exercise adherence, including variety, self-motivation, self-efficacy, and positive decision making. Benefits of exercise include fitness, good health, weight management, more energy, better sleep, less stress, and overall well-being.  Medical   Heart Disease Risk Reduction Clinical staff conducted group or individual video education with verbal and written material and guidebook.  Patient learns our heart is our most vital organ as it circulates oxygen, nutrients, white blood cells, and hormones throughout the entire body, and carries waste away. Data supports a plant-based eating plan like the Pritikin Program for its effectiveness in slowing progression of and reversing heart disease. The video provides a number of recommendations to address heart disease.   Metabolic Syndrome and Belly Fat  Clinical staff conducted group or individual video education with verbal and written material and guidebook.  Patient learns what metabolic syndrome is, how it leads to heart disease, and how one can reverse it and keep it from coming back. You have metabolic syndrome if you have 3 of the following 5 criteria: abdominal obesity, high blood pressure, high triglycerides, low HDL cholesterol, and high blood sugar.  Hypertension and Heart  Disease Clinical staff conducted group or individual video education with verbal and written material and guidebook.  Patient learns that high blood pressure, or hypertension, is very common in the [place] . Hypertension is largely due to excessive salt intake, but other important risk factors include being overweight, physical inactivity, drinking too much alcohol, smoking, and not eating enough potassium from fruits and vegetables. High blood pressure is a leading risk factor for heart attack, stroke, congestive heart failure, dementia, kidney failure, and premature death. Long-term effects of excessive salt intake include stiffening of the arteries and thickening of heart muscle and organ damage. Recommendations include ways to reduce hypertension and the risk of heart disease.  Diseases of Our Time - Focusing on Diabetes Clinical staff conducted group or individual video education with verbal and written material and guidebook.  Patient learns why the best way to stop diseases of our time is prevention, through food and other lifestyle changes. Medicine (such as prescription pills and surgeries) is often only a Band-Aid on the problem, not a long-term solution. Most common diseases of our time include obesity, type  2 diabetes, hypertension, heart disease, and cancer. The Pritikin Program is recommended and has been proven to help reduce, reverse, and/or prevent the damaging effects of metabolic syndrome.  Nutrition   Overview of the Pritikin Eating Plan  Clinical staff conducted group or individual video education with verbal and written material and guidebook.  Patient learns about the Pritikin Eating Plan for disease risk reduction. The Pritikin Eating Plan emphasizes a wide variety of unrefined, minimally-processed carbohydrates, like fruits, vegetables, whole grains, and legumes. Go, Caution, and Stop food choices are explained. Plant-based and lean animal proteins are emphasized. Rationale  provided for low sodium intake for blood pressure control, low added sugars for blood sugar stabilization, and low added fats and oils for coronary artery disease risk reduction and weight management.  Calorie Density  Clinical staff conducted group or individual video education with verbal and written material and guidebook.  Patient learns about calorie density and how it impacts the Pritikin Eating Plan. Knowing the characteristics of the food you choose will help you decide whether those foods will lead to weight gain or weight loss, and whether you want to consume more or less of them. Weight loss is usually a side effect of the Pritikin Eating Plan because of its focus on low calorie-dense foods.  Label Reading  Clinical staff conducted group or individual video education with verbal and written material and guidebook.  Patient learns about the Pritikin recommended label reading guidelines and corresponding recommendations regarding calorie density, added sugars, sodium content, and whole grains.  Dining Out - Part 1  Clinical staff conducted group or individual video education with verbal and written material and guidebook.  Patient learns that restaurant meals can be sabotaging because they can be so high in calories, fat, sodium, and/or sugar. Patient learns recommended strategies on how to positively address this and avoid unhealthy pitfalls.  Facts on Fats  Clinical staff conducted group or individual video education with verbal and written material and guidebook.  Patient learns that lifestyle modifications can be just as effective, if not more so, as many medications for lowering your risk of heart disease. A Pritikin lifestyle can help to reduce your risk of inflammation and atherosclerosis (cholesterol build-up, or plaque, in the artery walls). Lifestyle interventions such as dietary choices and physical activity address the cause of atherosclerosis. A review of the types of fats and  their impact on blood cholesterol levels, along with dietary recommendations to reduce fat intake is also included.  Nutrition Action Plan  Clinical staff conducted group or individual video education with verbal and written material and guidebook.  Patient learns how to incorporate Pritikin recommendations into their lifestyle. Recommendations include planning and keeping personal health goals in mind as an important part of their success.  Healthy Mind-Set    Healthy Minds, Bodies, Hearts  Clinical staff conducted group or individual video education with verbal and written material and guidebook.  Patient learns how to identify when they are stressed. Video will discuss the impact of that stress, as well as the many benefits of stress management. Patient will also be introduced to stress management techniques. The way we think, act, and feel has an impact on our hearts.  How Our Thoughts Can Heal Our Hearts  Clinical staff conducted group or individual video education with verbal and written material and guidebook.  Patient learns that negative thoughts can cause depression and anxiety. This can result in negative lifestyle behavior and serious health problems. Cognitive behavioral therapy is an effective  method to help control our thoughts in order to change and improve our emotional outlook.  Additional Videos:  Exercise    Improving Performance  Clinical staff conducted group or individual video education with verbal and written material and guidebook.  Patient learns to use a non-linear approach by alternating intensity levels and lengths of time spent exercising to help burn more calories and lose more body fat. Cardiovascular exercise helps improve heart health, metabolism, hormonal balance, blood sugar control, and recovery from fatigue. Resistance training improves strength, endurance, balance, coordination, reaction time, metabolism, and muscle mass. Flexibility exercise improves  circulation, posture, and balance. Seek guidance from your physician and exercise physiologist before implementing an exercise routine and learn your capabilities and proper form for all exercise.  Introduction to Yoga  Clinical staff conducted group or individual video education with verbal and written material and guidebook.  Patient learns about yoga, a discipline of the coming together of mind, breath, and body. The benefits of yoga include improved flexibility, improved range of motion, better posture and core strength, increased lung function, weight loss, and positive self-image. Yoga's heart health benefits include lowered blood pressure, healthier heart rate, decreased cholesterol and triglyceride levels, improved immune function, and reduced stress. Seek guidance from your physician and exercise physiologist before implementing an exercise routine and learn your capabilities and proper form for all exercise.  Medical   Aging: Enhancing Your Quality of Life  Clinical staff conducted group or individual video education with verbal and written material and guidebook.  Patient learns key strategies and recommendations to stay in good physical health and enhance quality of life, such as prevention strategies, having an advocate, securing a Health Care Proxy and Power of Attorney, and keeping a list of medications and system for tracking them. It also discusses how to avoid risk for bone loss.  Biology of Weight Control  Clinical staff conducted group or individual video education with verbal and written material and guidebook.  Patient learns that weight gain occurs because we consume more calories than we burn (eating more, moving less). Even if your body weight is normal, you may have higher ratios of fat compared to muscle mass. Too much body fat puts you at increased risk for cardiovascular disease, heart attack, stroke, type 2 diabetes, and obesity-related cancers. In addition to exercise,  following the Pritikin Eating Plan can help reduce your risk.  Decoding Lab Results  Clinical staff conducted group or individual video education with verbal and written material and guidebook.  Patient learns that lab test reflects one measurement whose values change over time and are influenced by many factors, including medication, stress, sleep, exercise, food, hydration, pre-existing medical conditions, and more. It is recommended to use the knowledge from this video to become more involved with your lab results and evaluate your numbers to speak with your doctor.   Diseases of Our Time - Overview  Clinical staff conducted group or individual video education with verbal and written material and guidebook.  Patient learns that according to the CDC, 50% to 70% of chronic diseases (such as obesity, type 2 diabetes, elevated lipids, hypertension, and heart disease) are avoidable through lifestyle improvements including healthier food choices, listening to satiety cues, and increased physical activity.  Sleep Disorders Clinical staff conducted group or individual video education with verbal and written material and guidebook.  Patient learns how good quality and duration of sleep are important to overall health and well-being. Patient also learns about sleep disorders and how they impact  health along with recommendations to address them, including discussing with a physician.  Nutrition  Dining Out - Part 2 Clinical staff conducted group or individual video education with verbal and written material and guidebook.  Patient learns how to plan ahead and communicate in order to maximize their dining experience in a healthy and nutritious manner. Included are recommended food choices based on the type of restaurant the patient is visiting.   Fueling a Banker conducted group or individual video education with verbal and written material and guidebook.  There is a strong  connection between our food choices and our health. Diseases like obesity and type 2 diabetes are very prevalent and are in large-part due to lifestyle choices. The Pritikin Eating Plan provides plenty of food and hunger-curbing satisfaction. It is easy to follow, affordable, and helps reduce health risks.  Menu Workshop  Clinical staff conducted group or individual video education with verbal and written material and guidebook.  Patient learns that restaurant meals can sabotage health goals because they are often packed with calories, fat, sodium, and sugar. Recommendations include strategies to plan ahead and to communicate with the manager, chef, or server to help order a healthier meal.  Planning Your Eating Strategy  Clinical staff conducted group or individual video education with verbal and written material and guidebook.  Patient learns about the Pritikin Eating Plan and its benefit of reducing the risk of disease. The Pritikin Eating Plan does not focus on calories. Instead, it emphasizes high-quality, nutrient-rich foods. By knowing the characteristics of the foods, we choose, we can determine their calorie density and make informed decisions.  Targeting Your Nutrition Priorities  Clinical staff conducted group or individual video education with verbal and written material and guidebook.  Patient learns that lifestyle habits have a tremendous impact on disease risk and progression. This video provides eating and physical activity recommendations based on your personal health goals, such as reducing LDL cholesterol, losing weight, preventing or controlling type 2 diabetes, and reducing high blood pressure.  Vitamins and Minerals  Clinical staff conducted group or individual video education with verbal and written material and guidebook.  Patient learns different ways to obtain key vitamins and minerals, including through a recommended healthy diet. It is important to discuss all supplements  you take with your doctor.   Healthy Mind-Set    Smoking Cessation  Clinical staff conducted group or individual video education with verbal and written material and guidebook.  Patient learns that cigarette smoking and tobacco addiction pose a serious health risk which affects millions of people. Stopping smoking will significantly reduce the risk of heart disease, lung disease, and many forms of cancer. Recommended strategies for quitting are covered, including working with your doctor to develop a successful plan.  Culinary   Becoming a Set designer conducted group or individual video education with verbal and written material and guidebook.  Patient learns that cooking at home can be healthy, cost-effective, quick, and puts them in control. Keys to cooking healthy recipes will include looking at your recipe, assessing your equipment needs, planning ahead, making it simple, choosing cost-effective seasonal ingredients, and limiting the use of added fats, salts, and sugars.  Cooking - Breakfast and Snacks  Clinical staff conducted group or individual video education with verbal and written material and guidebook.  Patient learns how important breakfast is to satiety and nutrition through the entire day. Recommendations include key foods to eat during breakfast to help stabilize blood  sugar levels and to prevent overeating at meals later in the day. Planning ahead is also a key component.  Cooking - Educational psychologist conducted group or individual video education with verbal and written material and guidebook.  Patient learns eating strategies to improve overall health, including an approach to cook more at home. Recommendations include thinking of animal protein as a side on your plate rather than center stage and focusing instead on lower calorie dense options like vegetables, fruits, whole grains, and plant-based proteins, such as beans. Making sauces in large  quantities to freeze for later and leaving the skin on your vegetables are also recommended to maximize your experience.  Cooking - Healthy Salads and Dressing Clinical staff conducted group or individual video education with verbal and written material and guidebook.  Patient learns that vegetables, fruits, whole grains, and legumes are the foundations of the Pritikin Eating Plan. Recommendations include how to incorporate each of these in flavorful and healthy salads, and how to create homemade salad dressings. Proper handling of ingredients is also covered. Cooking - Soups and [employer] - Soups and Desserts Clinical staff conducted group or individual video education with verbal and written material and guidebook.  Patient learns that Pritikin soups and desserts make for easy, nutritious, and delicious snacks and meal components that are low in sodium, fat, sugar, and calorie density, while high in vitamins, minerals, and filling fiber. Recommendations include simple and healthy ideas for soups and desserts.   Overview     The Pritikin Solution Program Overview Clinical staff conducted group or individual video education with verbal and written material and guidebook.  Patient learns that the results of the Pritikin Program have been documented in more than 100 articles published in peer-reviewed journals, and the benefits include reducing risk factors for (and, in some cases, even reversing) high cholesterol, high blood pressure, type 2 diabetes, obesity, and more! An overview of the three key pillars of the Pritikin Program will be covered: eating well, doing regular exercise, and having a healthy mind-set.  WORKSHOPS  Exercise: Exercise Basics: Building Your Action Plan Clinical staff led group instruction and group discussion with PowerPoint presentation and patient guidebook. To enhance the learning environment the use of posters, models and videos may be added. At the conclusion of  this workshop, patients will comprehend the difference between physical activity and exercise, as well as the benefits of incorporating both, into their routine. Patients will understand the FITT (Frequency, Intensity, Time, and Type) principle and how to use it to build an exercise action plan. In addition, safety concerns and other considerations for exercise and cardiac rehab will be addressed by the presenter. The purpose of this lesson is to promote a comprehensive and effective weekly exercise routine in order to improve patients' overall level of fitness.   Managing Heart Disease: Your Path to a Healthier Heart Clinical staff led group instruction and group discussion with PowerPoint presentation and patient guidebook. To enhance the learning environment the use of posters, models and videos may be added.At the conclusion of this workshop, patients will understand the anatomy and physiology of the heart. Additionally, they will understand how Pritikin's three pillars impact the risk factors, the progression, and the management of heart disease.  The purpose of this lesson is to provide a high-level overview of the heart, heart disease, and how the Pritikin lifestyle positively impacts risk factors.  Exercise Biomechanics Clinical staff led group instruction and group discussion with PowerPoint presentation  and patient guidebook. To enhance the learning environment the use of posters, models and videos may be added. Patients will learn how the structural parts of their bodies function and how these functions impact their daily activities, movement, and exercise. Patients will learn how to promote a neutral spine, learn how to manage pain, and identify ways to improve their physical movement in order to promote healthy living. The purpose of this lesson is to expose patients to common physical limitations that impact physical activity. Participants will learn practical ways to adapt and  manage aches and pains, and to minimize their effect on regular exercise. Patients will learn how to maintain good posture while sitting, walking, and lifting.  Balance Training and Fall Prevention  Clinical staff led group instruction and group discussion with PowerPoint presentation and patient guidebook. To enhance the learning environment the use of posters, models and videos may be added. At the conclusion of this workshop, patients will understand the importance of their sensorimotor skills (vision, proprioception, and the vestibular system) in maintaining their ability to balance as they age. Patients will apply a variety of balancing exercises that are appropriate for their current level of function. Patients will understand the common causes for poor balance, possible solutions to these problems, and ways to modify their physical environment in order to minimize their fall risk. The purpose of this lesson is to teach patients about the importance of maintaining balance as they age and ways to minimize their risk of falling.  WORKSHOPS   Nutrition:  Fueling a Ship broker led group instruction and group discussion with PowerPoint presentation and patient guidebook. To enhance the learning environment the use of posters, models and videos may be added. Patients will review the foundational principles of the Pritikin Eating Plan and understand what constitutes a serving size in each of the food groups. Patients will also learn Pritikin-friendly foods that are better choices when away from home and review make-ahead meal and snack options. Calorie density will be reviewed and applied to three nutrition priorities: weight maintenance, weight loss, and weight gain. The purpose of this lesson is to reinforce (in a group setting) the key concepts around what patients are recommended to eat and how to apply these guidelines when away from home by planning and selecting Pritikin-friendly  options. Patients will understand how calorie density may be adjusted for different weight management goals.  Mindful Eating  Clinical staff led group instruction and group discussion with PowerPoint presentation and patient guidebook. To enhance the learning environment the use of posters, models and videos may be added. Patients will briefly review the concepts of the Pritikin Eating Plan and the importance of low-calorie dense foods. The concept of mindful eating will be introduced as well as the importance of paying attention to internal hunger signals. Triggers for non-hunger eating and techniques for dealing with triggers will be explored. The purpose of this lesson is to provide patients with the opportunity to review the basic principles of the Pritikin Eating Plan, discuss the value of eating mindfully and how to measure internal cues of hunger and fullness using the Hunger Scale. Patients will also discuss reasons for non-hunger eating and learn strategies to use for controlling emotional eating.  Targeting Your Nutrition Priorities Clinical staff led group instruction and group discussion with PowerPoint presentation and patient guidebook. To enhance the learning environment the use of posters, models and videos may be added. Patients will learn how to determine their genetic susceptibility to disease by  reviewing their family history. Patients will gain insight into the importance of diet as part of an overall healthy lifestyle in mitigating the impact of genetics and other environmental insults. The purpose of this lesson is to provide patients with the opportunity to assess their personal nutrition priorities by looking at their family history, their own health history and current risk factors. Patients will also be able to discuss ways of prioritizing and modifying the Pritikin Eating Plan for their highest risk areas  Menu  Clinical staff led group instruction and group discussion with  PowerPoint presentation and patient guidebook. To enhance the learning environment the use of posters, models and videos may be added. Using menus brought in from [employer], or printed from [employer], patients will apply the Pritikin dining out guidelines that were presented in the [employer] video. Patients will also be able to practice these guidelines in a variety of provided scenarios. The purpose of this lesson is to provide patients with the opportunity to practice hands-on learning of the Pritikin Dining Out guidelines with actual menus and practice scenarios.  Label Reading Clinical staff led group instruction and group discussion with PowerPoint presentation and patient guidebook. To enhance the learning environment the use of posters, models and videos may be added. Patients will review and discuss the Pritikin label reading guidelines presented in Pritikin's Label Reading Educational series video. Using fool labels brought in from local grocery stores and markets, patients will apply the label reading guidelines and determine if the packaged food meet the Pritikin guidelines. The purpose of this lesson is to provide patients with the opportunity to review, discuss, and practice hands-on learning of the Pritikin Label Reading guidelines with actual packaged food labels. Cooking School  Pritikin's [employer] are designed to teach patients ways to prepare quick, simple, and affordable recipes at home. The importance of nutrition's role in chronic disease risk reduction is reflected in its emphasis in the overall Pritikin program. By learning how to prepare essential core Pritikin Eating Plan recipes, patients will increase control over what they eat; be able to customize the flavor of foods without the use of added salt, sugar, or fat; and improve the quality of the food they consume. By learning a set of core recipes which are easily assembled, quickly  prepared, and affordable, patients are more likely to prepare more healthy foods at home. These workshops focus on convenient breakfasts, simple entres, side dishes, and desserts which can be prepared with minimal effort and are consistent with nutrition recommendations for cardiovascular risk reduction. Cooking [employer] are taught by a Armed forces logistics/support/administrative officer (RD) who has been trained by the [employer]. The chef or RD has a clear understanding of the importance of minimizing - if not completely eliminating - added fat, sugar, and sodium in recipes. Throughout the series of Cooking School Workshop sessions, patients will learn about healthy ingredients and efficient methods of cooking to build confidence in their capability to prepare    Cooking School weekly topics:  Adding Flavor- Sodium-Free  Fast and Healthy Breakfasts  Powerhouse Plant-Based Proteins  Satisfying Salads and Dressings  Simple Sides and Sauces  International Cuisine-Spotlight on the [employer] Zones  Delicious Desserts  Savory Soups  [employer] - Meals in a Astronomer Appetizers and Snacks  Comforting Weekend Breakfasts  One-Pot Wonders   Fast Evening Meals  Landscape architect Your Pritikin Plate  WORKSHOPS   Healthy Mindset (Psychosocial):  Focused  Goals, Sustainable Changes Clinical staff led group instruction and group discussion with PowerPoint presentation and patient guidebook. To enhance the learning environment the use of posters, models and videos may be added. Patients will be able to apply effective goal setting strategies to establish at least one personal goal, and then take consistent, meaningful action toward that goal. They will learn to identify common barriers to achieving personal goals and develop strategies to overcome them. Patients will also gain an understanding of how our mind-set can impact our ability to achieve goals and the importance of  cultivating a positive and growth-oriented mind-set. The purpose of this lesson is to provide patients with a deeper understanding of how to set and achieve personal goals, as well as the tools and strategies needed to overcome common obstacles which may arise along the way.  From Head to Heart: The Power of a Healthy Outlook  Clinical staff led group instruction and group discussion with PowerPoint presentation and patient guidebook. To enhance the learning environment the use of posters, models and videos may be added. Patients will be able to recognize and describe the impact of emotions and mood on physical health. They will discover the importance of self-care and explore self-care practices which may work for them. Patients will also learn how to utilize the 4 C's to cultivate a healthier outlook and better manage stress and challenges. The purpose of this lesson is to demonstrate to patients how a healthy outlook is an essential part of maintaining good health, especially as they continue their cardiac rehab journey.  Healthy Sleep for a Healthy Heart Clinical staff led group instruction and group discussion with PowerPoint presentation and patient guidebook. To enhance the learning environment the use of posters, models and videos may be added. At the conclusion of this workshop, patients will be able to demonstrate knowledge of the importance of sleep to overall health, well-being, and quality of life. They will understand the symptoms of, and treatments for, common sleep disorders. Patients will also be able to identify daytime and nighttime behaviors which impact sleep, and they will be able to apply these tools to help manage sleep-related challenges. The purpose of this lesson is to provide patients with a general overview of sleep and outline the importance of quality sleep. Patients will learn about a few of the most common sleep disorders. Patients will also be introduced to the concept of  "sleep hygiene," and discover ways to self-manage certain sleeping problems through simple daily behavior changes. Finally, the workshop will motivate patients by clarifying the links between quality sleep and their goals of heart-healthy living.   Recognizing and Reducing Stress Clinical staff led group instruction and group discussion with PowerPoint presentation and patient guidebook. To enhance the learning environment the use of posters, models and videos may be added. At the conclusion of this workshop, patients will be able to understand the types of stress reactions, differentiate between acute and chronic stress, and recognize the impact that chronic stress has on their health. They will also be able to apply different coping mechanisms, such as reframing negative self-talk. Patients will have the opportunity to practice a variety of stress management techniques, such as deep abdominal breathing, progressive muscle relaxation, and/or guided imagery.  The purpose of this lesson is to educate patients on the role of stress in their lives and to provide healthy techniques for coping with it.  Learning Barriers/Preferences:  Learning Barriers/Preferences - 06/03/24 1131       Learning Barriers/Preferences  Learning Barriers Sight    Learning Preferences Audio;Computer/Internet;Group Instruction;Individual Instruction;Pictoral;Skilled Demonstration;Verbal Instruction;Video;Written Material          Education Topics:  Knowledge Questionnaire Score:  Knowledge Questionnaire Score - 06/03/24 1132       Knowledge Questionnaire Score   Pre Score 23/24          Core Components/Risk Factors/Patient Goals at Admission:  Personal Goals and Risk Factors at Admission - 06/03/24 1135       Core Components/Risk Factors/Patient Goals on Admission    Weight Management Obesity;Weight Loss    Diabetes Yes    Intervention Provide education about signs/symptoms and action to take for  hypo/hyperglycemia.;Provide education about proper nutrition, including hydration, and aerobic/resistive exercise prescription along with prescribed medications to achieve blood glucose in normal ranges: Fasting glucose 65-99 mg/dL    Expected Outcomes Short Term: Participant verbalizes understanding of the signs/symptoms and immediate care of hyper/hypoglycemia, proper foot care and importance of medication, aerobic/resistive exercise and nutrition plan for blood glucose control.;Long Term: Attainment of HbA1C < 7%.    Hypertension Yes    Intervention Monitor prescription use compliance.;Provide education on lifestyle modifcations including regular physical activity/exercise, weight management, moderate sodium restriction and increased consumption of fresh fruit, vegetables, and low fat dairy, alcohol moderation, and smoking cessation.    Expected Outcomes Short Term: Continued assessment and intervention until BP is < 140/41mm HG in hypertensive participants. < 130/25mm HG in hypertensive participants with diabetes, heart failure or chronic kidney disease.;Long Term: Maintenance of blood pressure at goal levels.    Lipids Yes    Intervention Provide education and support for participant on nutrition & aerobic/resistive exercise along with prescribed medications to achieve LDL 70mg , HDL >40mg .    Expected Outcomes Short Term: Participant states understanding of desired cholesterol values and is compliant with medications prescribed. Participant is following exercise prescription and nutrition guidelines.;Long Term: Cholesterol controlled with medications as prescribed, with individualized exercise RX and with personalized nutrition plan. Value goals: LDL < 70mg , HDL > 40 mg.    Stress Yes    Intervention Offer individual and/or small group education and counseling on adjustment to heart disease, stress management and health-related lifestyle change. Teach and support self-help strategies.;Refer  participants experiencing significant psychosocial distress to appropriate mental health specialists for further evaluation and treatment. When possible, include family members and significant others in education/counseling sessions.    Expected Outcomes Short Term: Participant demonstrates changes in health-related behavior, relaxation and other stress management skills, ability to obtain effective social support, and compliance with psychotropic medications if prescribed.;Long Term: Emotional wellbeing is indicated by absence of clinically significant psychosocial distress or social isolation.          Core Components/Risk Factors/Patient Goals Review:    Core Components/Risk Factors/Patient Goals at Discharge (Final Review):    ITP Comments:  ITP Comments     Row Name 06/03/24 1007           ITP Comments Wilbert Holland, MD:  Medical Director.  Intorduction to the [employer] / Intensive Cardiac Rehab.  Initial orientation packet reviewed with the patinet.          Comments: Participant attended orientation for the cardiac rehabilitation program on  06/03/2024  to perform initial intake and exercise walk test. Patient introduced to the Pritikin Program education and orientation packet was reviewed. Completed 6-minute walk test, measurements, initial ITP, and exercise prescription. Vital signs stable. Telemetry-normal sinus rhythm, 1st degree AVB, asymptomatic.   Service time was from  900 to 1103.

## 2024-06-11 ENCOUNTER — Encounter (HOSPITAL_COMMUNITY)
Admission: RE | Admit: 2024-06-11 | Discharge: 2024-06-11 | Disposition: A | Discharge status: Home / Self Care | Source: Ambulatory Visit | Attending: Internal Medicine

## 2024-06-11 DIAGNOSIS — Z955 Presence of coronary angioplasty implant and graft: Secondary | ICD-10-CM

## 2024-06-11 LAB — GLUCOSE, CAPILLARY
Glucose-Capillary: 152 mg/dL — ABNORMAL HIGH (ref 70–99)
Glucose-Capillary: 150 mg/dL — ABNORMAL HIGH (ref 70–99)

## 2024-06-11 NOTE — Progress Notes (Signed)
 Daily Session Note  Patient Details  Name: Kathleen Reid MRN: [id number] Date of Birth: [birth year] Referring Provider:   Flowsheet Row INTENSIVE CARDIAC REHAB ORIENT from 06/03/2024 in [facility]  Referring Provider Lurena Red, MD    Encounter Date: 06/11/2024  Check In:  Session Check In - 06/11/24 9157       Check-In   Supervising physician immediately available to respond to emergencies [facility] MD immediately available    Physician(s) Damien Braver, NP    Location [facility]    Staff Present Alm Parkins, MS, ACSM-CEP, CCRP, Exercise Physiologist;Olinty Valere, MS, ACSM-CEP, Exercise Physiologist;Jetta Vannie BS, ACSM-CEP, Exercise Physiologist;Kaeleigh Westendorf, RN, BSN    Virtual Visit No    Medication changes reported     No    Fall or balance concerns reported    No    Tobacco Cessation No Change    Warm-up and Cool-down Performed as group-led instruction    Resistance Training Performed No    VAD Patient? No    PAD/SET Patient? No      Pain Assessment   Currently in Pain? No/denies    Pain Score 0-No pain    Multiple Pain Sites No          Capillary Blood Glucose: Results for orders placed or performed during the hospital encounter of 06/03/24 (from the past 24 hours)  Glucose, capillary     Status: Abnormal   Collection Time: 06/11/24  9:04 AM  Result Value Ref Range   Glucose-Capillary 152 (H) 70 - 99 mg/dL  Glucose, capillary     Status: Abnormal   Collection Time: 06/11/24 10:00 AM  Result Value Ref Range   Glucose-Capillary 150 (H) 70 - 99 mg/dL     Exercise Prescription Changes - 06/11/24 1600       Response to Exercise   Blood Pressure (Admit) 124/70    Blood Pressure (Exercise) 140/60    Blood Pressure (Exit) 114/72    Heart Rate (Admit) 83 bpm    Heart Rate (Exercise) 113 bpm    Heart Rate (Exit) 86 bpm    Rating of Perceived Exertion (Exercise) 13    Symptoms  None    Comments Pt's first day in the [facility]2 program    Duration Continue with 30 min of aerobic exercise without signs/symptoms of physical distress.    Intensity THRR unchanged      Progression   Progression Continue to progress workloads to maintain intensity without signs/symptoms of physical distress.    Average METs 2.4      Resistance Training   Training Prescription No    Weight No weights on wednesdays      Interval Training   Interval Training No      Recumbant Bike   Level 1    RPM 73    Watts 16    Minutes 17    METs 2      NuStep   Level 2    Minutes 15    METs 2.8          Social History   Tobacco Use  Smoking Status Never  Smokeless Tobacco Never    Goals Met:  Exercise tolerated well No report of concerns or symptoms today  Goals Unmet:  Not Applicable  Comments: Pt started cardiac rehab today.  Pt tolerated light exercise without difficulty. VSS, telemetry-Sinus Rhythm, asymptomatic.  Medication list reconciled. Pt denies barriers to medicaiton compliance.  PSYCHOSOCIAL ASSESSMENT:  PHQ-7. Kathleen Reid admits to having some depression, difficulty sleeping and some fatigue since her cardiac event   Pt enjoys travelling sewing, reading and dancing.   Pt oriented to exercise equipment and routine.    Understanding verbalized.Hadassah Elpidio Quan RN BSN    Dr. Wilbert Bihari is Medical Director for Cardiac Rehab at [facility].

## 2024-06-16 ENCOUNTER — Encounter (HOSPITAL_COMMUNITY)
Admission: RE | Admit: 2024-06-16 | Discharge: 2024-06-16 | Disposition: A | Discharge status: Home / Self Care | Source: Ambulatory Visit | Attending: Internal Medicine | Admitting: Internal Medicine

## 2024-06-16 DIAGNOSIS — Z955 Presence of coronary angioplasty implant and graft: Secondary | ICD-10-CM

## 2024-06-16 NOTE — Progress Notes (Signed)
 Cardiac Individual Treatment Plan  Patient Details  Name: Kathleen Reid MRN: [id number] Date of Birth: 18-Jan-1956 Referring Provider:   Flowsheet Row INTENSIVE CARDIAC REHAB ORIENT from 06/03/2024 in [facility]  Referring Provider Lurena Red, MD    Initial Encounter Date:  Flowsheet Row INTENSIVE CARDIAC REHAB ORIENT from 06/03/2024 in [facility], [facility], [facility]  Date 06/03/24    Visit Diagnosis: 02/26/24 Status post coronary artery stent placement  Patient's Home Medications on Admission:  Current Outpatient Medications:    ACCU-CHEK SMARTVIEW test strip, TEST three times a day, Disp: 100 each, Rfl: 2   aspirin 81 MG chewable tablet, Chew 81 mg by mouth daily., Disp: , Rfl:    clobetasol (TEMOVATE) 0.05 % external solution, APP 1 ML EXT AA Q NIGHT (Patient not taking: Reported on 06/03/2024), Disp: , Rfl:    clopidogrel  (PLAVIX ) 75 MG tablet, Take 1 tablet (75 mg total) by mouth daily., Disp: 90 tablet, Rfl: 3   ergocalciferol (VITAMIN D2) 1.25 MG (50000 UT) capsule, Take 50,000 Units by mouth once a week., Disp: , Rfl:    glimepiride (AMARYL) 2 MG tablet, Take 2 mg by mouth every morning., Disp: , Rfl:    hydrochlorothiazide (HYDRODIURIL) 12.5 MG tablet, Take 12.5 mg by mouth daily., Disp: , Rfl:    Insulin  Pen Needle (BD PEN NEEDLE NANO U/F) 32G X 4 MM MISC, Use one per day, Disp: 30 each, Rfl: 5   isosorbide  mononitrate (IMDUR ) 30 MG 24 hr tablet, Take 1 tablet (30 mg total) by mouth daily., Disp: 90 tablet, Rfl: 3   losartan  (COZAAR ) 25 MG tablet, Take 1 tablet (25 mg total) by mouth daily., Disp: 90 tablet, Rfl: 3   metoprolol tartrate (LOPRESSOR) 25 MG tablet, Take 25 mg by mouth 2 (two) times daily., Disp: , Rfl:    nitroGLYCERIN  (NITROSTAT ) 0.4 MG SL tablet, Place 1 tablet (0.4 mg total) under the tongue every 5 (five) minutes as needed., Disp: 25 tablet, Rfl: 3    rosuvastatin  (CRESTOR ) 20 MG tablet, Take 1 tablet (20 mg total) by mouth daily., Disp: 90 tablet, Rfl: 3   tirzepatide (MOUNJARO) 2.5 MG/0.5ML Pen, Inject 7.5 mg into the skin once a week. Pt states this was increased to 7.5 mg on 05/30/2024, Disp: , Rfl:   Past Medical History: Past Medical History:  Diagnosis Date   Diabetes mellitus without complication (HCC)    Eye disease    HLD (hyperlipidemia)     Tobacco Use: Social History   Tobacco Use  Smoking Status Never  Smokeless Tobacco Never    Labs: Review Flowsheet  More data exists      Latest Ref Rng & Units 10/08/2014 05/17/2015 02/25/2016 09/11/2016 10/16/2016  Labs for ITP Cardiac and Pulmonary Rehab  Cholestrol 0 - 200 mg/dL - - - - 801   LDL (calc) 0 - 99 mg/dL - - - - 870   HDL-C >60.99 mg/dL - - - - 60.39   Trlycerides 0.0 - 149.0 mg/dL - - - - 856.9   Hemoglobin A1c - 7.8  6.7  9.8  10.7  -    Capillary Blood Glucose: Lab Results  Component Value Date   GLUCAP 150 (H) 06/11/2024   GLUCAP 152 (H) 06/11/2024   GLUCAP 286 (H) 04/10/2011   GLUCAP 304 (H) 12/23/2010     Exercise Target Goals: Exercise Program Goal: Individual exercise prescription set using results from  initial 6 min walk test and THRR while considering  patient's activity barriers and safety.   Exercise Prescription Goal: Initial exercise prescription builds to 30-45 minutes a day of aerobic activity, 2-3 days per week.  Home exercise guidelines will be given to patient during program as part of exercise prescription that the participant will acknowledge.  Activity Barriers & Risk Stratification:  Activity Barriers & Cardiac Risk Stratification - 06/03/24 1125       Activity Barriers & Cardiac Risk Stratification   Activity Barriers Arthritis;Balance Concerns;History of Falls;Joint Problems;Other (comment)    Comments Pt has depth perception issues.    Cardiac Risk Stratification High          6 Minute Walk:  6 Minute Walk     Row  Name 06/03/24 1050         6 Minute Walk   Phase Initial     Distance 1136 feet     Walk Time 6 minutes     # of Rest Breaks 0     MPH 2.15     METS 2.6     RPE 10     Perceived Dyspnea  0     VO2 Peak 9.07     Symptoms No     Resting HR 71 bpm     Resting BP 122/60     Resting Oxygen Saturation  97 %     Exercise Oxygen Saturation  during 6 min walk 95 %     Max Ex. HR 90 bpm     Max Ex. BP 150/64     2 Minute Post BP 124/62        Oxygen Initial Assessment:   Oxygen Re-Evaluation:   Oxygen Discharge (Final Oxygen Re-Evaluation):   Initial Exercise Prescription:  Initial Exercise Prescription - 06/03/24 1100       Date of Initial Exercise RX and Referring Provider   Date 06/03/24    Referring Provider Lurena Red, MD    Expected Discharge Date 08/27/24      Recumbant Bike   Level 1    RPM 60    Watts 17    Minutes 15    METs 2.6      NuStep   Level 2    SPM 75    Minutes 15    METs 2.6      Prescription Details   Frequency (times per week) 2   Pt will attend M/W   Duration Progress to 30 minutes of continuous aerobic without signs/symptoms of physical distress      Intensity   THRR 40-80% of Max Heartrate 61-122    Ratings of Perceived Exertion 11-13    Perceived Dyspnea 0-4      Progression   Progression Continue progressive overload as per policy without signs/symptoms or physical distress.      Resistance Training   Training Prescription Yes    Weight 3 lb    Reps 10-15          Perform Capillary Blood Glucose checks as needed.  Exercise Prescription Changes:   Exercise Prescription Changes     Row Name 06/11/24 1600             Response to Exercise   Blood Pressure (Admit) 124/70       Blood Pressure (Exercise) 140/60       Blood Pressure (Exit) 114/72       Heart Rate (Admit) 83 bpm       Heart Rate (  Exercise) 113 bpm       Heart Rate (Exit) 86 bpm       Rating of Perceived Exertion (Exercise) 13       Symptoms  None       Comments Pt's first day in the CRP2 program       Duration Continue with 30 min of aerobic exercise without signs/symptoms of physical distress.       Intensity THRR unchanged         Progression   Progression Continue to progress workloads to maintain intensity without signs/symptoms of physical distress.       Average METs 2.4         Resistance Training   Training Prescription No       Weight No weights on wednesdays         Interval Training   Interval Training No         Recumbant Bike   Level 1       RPM 73       Watts 16       Minutes 17       METs 2         NuStep   Level 2       Minutes 15       METs 2.8          Exercise Comments:   Exercise Comments     Row Name 06/11/24 1648           Exercise Comments Pt's first day in the CRP2 program. Pt exercised without complaints. Pt off to a good start.          Exercise Goals and Review:   Exercise Goals     Row Name 06/03/24 1011             Exercise Goals   Increase Physical Activity Yes       Intervention Provide advice, education, support and counseling about physical activity/exercise needs.;Develop an individualized exercise prescription for aerobic and resistive training based on initial evaluation findings, risk stratification, comorbidities and participant's personal goals.       Expected Outcomes Short Term: Attend rehab on a regular basis to increase amount of physical activity.;Long Term: Add in home exercise to make exercise part of routine and to increase amount of physical activity.;Long Term: Exercising regularly at least 3-5 days a week.       Increase Strength and Stamina Yes       Intervention Provide advice, education, support and counseling about physical activity/exercise needs.;Develop an individualized exercise prescription for aerobic and resistive training based on initial evaluation findings, risk stratification, comorbidities and participant's personal goals.        Expected Outcomes Short Term: Increase workloads from initial exercise prescription for resistance, speed, and METs.;Short Term: Perform resistance training exercises routinely during rehab and add in resistance training at home;Long Term: Improve cardiorespiratory fitness, muscular endurance and strength as measured by increased METs and functional capacity ( )       Able to understand and use rate of perceived exertion (RPE) scale Yes       Intervention Provide education and explanation on how to use RPE scale       Expected Outcomes Short Term: Able to use RPE daily in rehab to express subjective intensity level;Long Term:  Able to use RPE to guide intensity level when exercising independently       Knowledge and understanding of Target Heart Rate Range (THRR) Yes  Intervention Provide education and explanation of THRR including how the numbers were predicted and where they are located for reference       Expected Outcomes Short Term: Able to state/look up THRR;Long Term: Able to use THRR to govern intensity when exercising independently;Short Term: Able to use daily as guideline for intensity in rehab       Understanding of Exercise Prescription Yes       Intervention Provide education, explanation, and written materials on patient's individual exercise prescription       Expected Outcomes Short Term: Able to explain program exercise prescription;Long Term: Able to explain home exercise prescription to exercise independently          Exercise Goals Re-Evaluation :  Exercise Goals Re-Evaluation     Row Name 06/11/24 1647             Exercise Goal Re-Evaluation   Exercise Goals Review Increase Physical Activity;Understanding of Exercise Prescription;Increase Strength and Stamina;Knowledge and understanding of Target Heart Rate Range (THRR);Able to understand and use rate of perceived exertion (RPE) scale       Comments Pt's first day in the CRP2 program. Pt understands the exercise  Rx, RPE sclae and THRR.       Expected Outcomes Will continue to monitor the patient and progress as tolerated.          Discharge Exercise Prescription (Final Exercise Prescription Changes):  Exercise Prescription Changes - 06/11/24 1600       Response to Exercise   Blood Pressure (Admit) 124/70    Blood Pressure (Exercise) 140/60    Blood Pressure (Exit) 114/72    Heart Rate (Admit) 83 bpm    Heart Rate (Exercise) 113 bpm    Heart Rate (Exit) 86 bpm    Rating of Perceived Exertion (Exercise) 13    Symptoms None    Comments Pt's first day in the CRP2 program    Duration Continue with 30 min of aerobic exercise without signs/symptoms of physical distress.    Intensity THRR unchanged      Progression   Progression Continue to progress workloads to maintain intensity without signs/symptoms of physical distress.    Average METs 2.4      Resistance Training   Training Prescription No    Weight No weights on wednesdays      Interval Training   Interval Training No      Recumbant Bike   Level 1    RPM 73    Watts 16    Minutes 17    METs 2      NuStep   Level 2    Minutes 15    METs 2.8          Nutrition:  Target Goals: Understanding of nutrition guidelines, daily intake of sodium 1500mg , cholesterol 200mg , calories 30% from fat and 7% or less from saturated fats, daily to have 5 or more servings of fruits and vegetables.  Biometrics:  Pre Biometrics - 06/03/24 0948       Pre Biometrics   Waist Circumference 40 inches    Hip Circumference 41.2 inches    Waist to Hip Ratio 0.97 %    Triceps Skinfold 20 mm    % Body Fat 40.3 %    Grip Strength 34 kg    Flexibility 17.5 in    Single Leg Stand 3.5 seconds           Nutrition Therapy Plan and Nutrition Goals:  Nutrition Therapy & Goals -  06/11/24 1556       Nutrition Therapy   Diet Heart Healthy Diet    Drug/Food Interactions Statins/Certain Fruits      Personal Nutrition Goals   Nutrition Goal  Patient to identify strategies for reducing cardiovascular risk by attending the Pritikin education and nutrition series weekly.    Personal Goal #2 Patient to improve diet quality by using the plate method as a guide for meal planning to include lean protein/plant protein, fruits, vegetables, whole grains, nonfat dairy as part of a well-balanced diet.    Personal Goal #3 Patient to identify strategies for weight loss of 0.5-2.0# per week.    Comments Kathleen Reid has medical history of CAD s/p PCI, DM2, HTN, hyperlipidemia, aortic atherosclerosis, CKD2. She continues mounjaro for weight loss and blood sugar control. LDL is at goal. Patient will benefit from participation in intensive cardiac rehab for nutrition education, exercise, and lifestyle modification.      Intervention Plan   Intervention Prescribe, educate and counsel regarding individualized specific dietary modifications aiming towards targeted core components such as weight, hypertension, lipid management, diabetes, heart failure and other comorbidities.;Nutrition handout(s) given to patient.    Expected Outcomes Short Term Goal: Understand basic principles of dietary content, such as calories, fat, sodium, cholesterol and nutrients.;Long Term Goal: Adherence to prescribed nutrition plan.          Nutrition Assessments:  MEDIFICTS Score Key: >=70 Need to make dietary changes  40-70 Heart Healthy Diet <= 40 Therapeutic Level Cholesterol Diet   Flowsheet Row INTENSIVE CARDIAC REHAB from 06/11/2024 in [facility], [facility]  Picture Your Plate Total Score on Admission 48   Picture Your Plate Scores: <59 Unhealthy dietary pattern with much room for improvement. 41-50 Dietary pattern unlikely to meet recommendations for good health and room for improvement. 51-60 More healthful dietary pattern, with some room for improvement.  >60 Healthy dietary pattern, although there may be some specific  behaviors that could be improved.    Nutrition Goals Re-Evaluation:  Nutrition Goals Re-Evaluation     Row Name 06/11/24 1556             Goals   Current Weight 193 lb 12.6 oz (87.9 kg)       Comment LDL 64, HDL 34       Expected Outcome Kathleen Reid has medical history of CAD s/p PCI, DM2, HTN, hyperlipidemia, aortic atherosclerosis, CKD2. She continues mounjaro for weight loss and blood sugar control. LDL is at goal. Patient will benefit from participation in intensive cardiac rehab for nutrition education, exercise, and lifestyle modification.          Nutrition Goals Re-Evaluation:  Nutrition Goals Re-Evaluation     Row Name 06/11/24 1556             Goals   Current Weight 193 lb 12.6 oz (87.9 kg)       Comment LDL 64, HDL 34       Expected Outcome Kathleen Reid has medical history of CAD s/p PCI, DM2, HTN, hyperlipidemia, aortic atherosclerosis, CKD2. She continues mounjaro for weight loss and blood sugar control. LDL is at goal. Patient will benefit from participation in intensive cardiac rehab for nutrition education, exercise, and lifestyle modification.          Nutrition Goals Discharge (Final Nutrition Goals Re-Evaluation):  Nutrition Goals Re-Evaluation - 06/11/24 1556       Goals   Current Weight 193 lb 12.6 oz (87.9 kg)  Comment LDL 64, HDL 34    Expected Outcome Kathleen Reid has medical history of CAD s/p PCI, DM2, HTN, hyperlipidemia, aortic atherosclerosis, CKD2. She continues mounjaro for weight loss and blood sugar control. LDL is at goal. Patient will benefit from participation in intensive cardiac rehab for nutrition education, exercise, and lifestyle modification.          Psychosocial: Target Goals: Acknowledge presence or absence of significant depression and/or stress, maximize coping skills, provide positive support system. Participant is able to verbalize types and ability to use techniques and skills needed for reducing stress and depression.  Initial  Review & Psychosocial Screening:  Initial Psych Review & Screening - 06/03/24 1012       Initial Review   Current issues with Current Depression;Current Sleep Concerns      Family Dynamics   Good Support System? Yes   Pt has daughters, sisters, brothers, extended family     Barriers   Psychosocial barriers to participate in program The patient should benefit from training in stress management and relaxation.      Screening Interventions   Interventions Encouraged to exercise    Expected Outcomes Short Term goal: Utilizing psychosocial counselor, staff and physician to assist with identification of specific Stressors or current issues interfering with healing process. Setting desired goal for each stressor or current issue identified.;Long Term Goal: Stressors or current issues are controlled or eliminated.;Short Term goal: Identification and review with participant of any Quality of Life or Depression concerns found by scoring the questionnaire.;Long Term goal: The participant improves quality of Life and PHQ9 Scores as seen by post scores and/or verbalization of changes          Quality of Life Scores:  Quality of Life - 06/03/24 1120       Quality of Life   Select Quality of Life      Quality of Life Scores   Health/Function Pre 15 %    Socioeconomic Pre 23.79 %    Psych/Spiritual Pre 19.64 %    Family Pre 12 %    GLOBAL Pre 17.74 %         Scores of 19 and below usually indicate a poorer quality of life in these areas.  A difference of  2-3 points is a clinically meaningful difference.  A difference of 2-3 points in the total score of the Quality of Life Index has been associated with significant improvement in overall quality of life, self-image, physical symptoms, and general health in studies assessing change in quality of life.  PHQ-9: Review Flowsheet       06/03/2024 12/24/2013  Depression screen PHQ 2/9  Decreased Interest 0 0  Down, Depressed, Hopeless 1 0  PHQ  - 2 Score 1 0  Altered sleeping 3 -  Tired, decreased energy 1 -  Change in appetite 0 -  Feeling bad or failure about yourself  2 -  Trouble concentrating 0 -  Moving slowly or fidgety/restless 0 -  Suicidal thoughts 0 -  PHQ-9 Score 7 -  Difficult doing work/chores Not difficult at all -   Interpretation of Total Score  Total Score Depression Severity:  1-4 = Minimal depression, 5-9 = Mild depression, 10-14 = Moderate depression, 15-19 = Moderately severe depression, 20-27 = Severe depression   Psychosocial Evaluation and Intervention:   Psychosocial Re-Evaluation:  Psychosocial Re-Evaluation     Row Name 06/11/24 1710             Psychosocial Re-Evaluation   Current  issues with Current Depression;Current Sleep Concerns       Comments Kathleen Reid started cardiac rehab on 06/11/24. Kathleen Reid did not voice any increased concerns or stressors on her first day of exercise       Expected Outcomes Kathleen Reid will have controlled or decreased depression/ stressors upon completion of cardiac rehab       Interventions Stress management education;Relaxation education;Encouraged to attend Cardiac Rehabilitation for the exercise       Continue Psychosocial Services  Follow up required by staff          Psychosocial Discharge (Final Psychosocial Re-Evaluation):  Psychosocial Re-Evaluation - 06/11/24 1710       Psychosocial Re-Evaluation   Current issues with Current Depression;Current Sleep Concerns    Comments Kathleen Reid started cardiac rehab on 06/11/24. Kathleen Reid did not voice any increased concerns or stressors on her first day of exercise    Expected Outcomes Kathleen Reid will have controlled or decreased depression/ stressors upon completion of cardiac rehab    Interventions Stress management education;Relaxation education;Encouraged to attend Cardiac Rehabilitation for the exercise    Continue Psychosocial Services  Follow up required by staff          Vocational Rehabilitation: Provide  vocational rehab assistance to qualifying candidates.   Vocational Rehab Evaluation & Intervention:  Vocational Rehab - 06/03/24 1013       Initial Vocational Rehab Evaluation & Intervention   Assessment shows need for Vocational Rehabilitation No   Pt is retired         Education: Education Goals: Education classes will be provided on a weekly basis, covering required topics. Participant will state understanding/return demonstration of topics presented.    Education     Row Name 06/11/24 0800     Education   Cardiac Education Topics Pritikin   Secondary school teacher School   Educator Dietitian   Weekly Topic Simple Sides and Sauces   Instruction Review Code 1- Verbalizes Understanding   Class Start Time 0815   Class Stop Time 0846   Class Time Calculation (min) 31 min    Row Name 06/16/24 0800     Education   Cardiac Education Topics Pritikin   Select Core Videos     Core Videos   Educator Exercise Physiologist   Select General Education   General Education Hypertension and Heart Disease   Instruction Review Code 1- Verbalizes Understanding   Class Start Time 0815   Class Stop Time 0851   Class Time Calculation (min) 36 min      Core Videos: Exercise    Move It!  Clinical staff conducted group or individual video education with verbal and written material and guidebook.  Patient learns the recommended Pritikin exercise program. Exercise with the goal of living a long, healthy life. Some of the health benefits of exercise include controlled diabetes, healthier blood pressure levels, improved cholesterol levels, improved heart and lung capacity, improved sleep, and better body composition. Everyone should speak with their doctor before starting or changing an exercise routine.  Biomechanical Limitations Clinical staff conducted group or individual video education with verbal and written material and guidebook.  Patient learns how biomechanical  limitations can impact exercise and how we can mitigate and possibly overcome limitations to have an impactful and balanced exercise routine.  Body Composition Clinical staff conducted group or individual video education with verbal and written material and guidebook.  Patient learns that body composition (ratio of muscle mass to fat mass) is a key  component to assessing overall fitness, rather than body weight alone. Increased fat mass, especially visceral belly fat, can put us  at increased risk for metabolic syndrome, type 2 diabetes, heart disease, and even death. It is recommended to combine diet and exercise (cardiovascular and resistance training) to improve your body composition. Seek guidance from your physician and exercise physiologist before implementing an exercise routine.  Exercise Action Plan Clinical staff conducted group or individual video education with verbal and written material and guidebook.  Patient learns the recommended strategies to achieve and enjoy long-term exercise adherence, including variety, self-motivation, self-efficacy, and positive decision making. Benefits of exercise include fitness, good health, weight management, more energy, better sleep, less stress, and overall well-being.  Medical   Heart Disease Risk Reduction Clinical staff conducted group or individual video education with verbal and written material and guidebook.  Patient learns our heart is our most vital organ as it circulates oxygen, nutrients, white blood cells, and hormones throughout the entire body, and carries waste away. Data supports a plant-based eating plan like the Pritikin Program for its effectiveness in slowing progression of and reversing heart disease. The video provides a number of recommendations to address heart disease.   Metabolic Syndrome and Belly Fat  Clinical staff conducted group or individual video education with verbal and written material and guidebook.  Patient learns  what metabolic syndrome is, how it leads to heart disease, and how one can reverse it and keep it from coming back. You have metabolic syndrome if you have 3 of the following 5 criteria: abdominal obesity, high blood pressure, high triglycerides, low HDL cholesterol, and high blood sugar.  Hypertension and Heart Disease Clinical staff conducted group or individual video education with verbal and written material and guidebook.  Patient learns that high blood pressure, or hypertension, is very common in the [place] . Hypertension is largely due to excessive salt intake, but other important risk factors include being overweight, physical inactivity, drinking too much alcohol, smoking, and not eating enough potassium from fruits and vegetables. High blood pressure is a leading risk factor for heart attack, stroke, congestive heart failure, dementia, kidney failure, and premature death. Long-term effects of excessive salt intake include stiffening of the arteries and thickening of heart muscle and organ damage. Recommendations include ways to reduce hypertension and the risk of heart disease.  Diseases of Our Time - Focusing on Diabetes Clinical staff conducted group or individual video education with verbal and written material and guidebook.  Patient learns why the best way to stop diseases of our time is prevention, through food and other lifestyle changes. Medicine (such as prescription pills and surgeries) is often only a Band-Aid on the problem, not a long-term solution. Most common diseases of our time include obesity, type 2 diabetes, hypertension, heart disease, and cancer. The Pritikin Program is recommended and has been proven to help reduce, reverse, and/or prevent the damaging effects of metabolic syndrome.  Nutrition   Overview of the Pritikin Eating Plan  Clinical staff conducted group or individual video education with verbal and written material and guidebook.  Patient learns about the  Pritikin Eating Plan for disease risk reduction. The Pritikin Eating Plan emphasizes a wide variety of unrefined, minimally-processed carbohydrates, like fruits, vegetables, whole grains, and legumes. Go, Caution, and Stop food choices are explained. Plant-based and lean animal proteins are emphasized. Rationale provided for low sodium intake for blood pressure control, low added sugars for blood sugar stabilization, and low added fats and oils for coronary  artery disease risk reduction and weight management.  Calorie Density  Clinical staff conducted group or individual video education with verbal and written material and guidebook.  Patient learns about calorie density and how it impacts the Pritikin Eating Plan. Knowing the characteristics of the food you choose will help you decide whether those foods will lead to weight gain or weight loss, and whether you want to consume more or less of them. Weight loss is usually a side effect of the Pritikin Eating Plan because of its focus on low calorie-dense foods.  Label Reading  Clinical staff conducted group or individual video education with verbal and written material and guidebook.  Patient learns about the Pritikin recommended label reading guidelines and corresponding recommendations regarding calorie density, added sugars, sodium content, and whole grains.  Dining Out - Part 1  Clinical staff conducted group or individual video education with verbal and written material and guidebook.  Patient learns that restaurant meals can be sabotaging because they can be so high in calories, fat, sodium, and/or sugar. Patient learns recommended strategies on how to positively address this and avoid unhealthy pitfalls.  Facts on Fats  Clinical staff conducted group or individual video education with verbal and written material and guidebook.  Patient learns that lifestyle modifications can be just as effective, if not more so, as many medications for lowering  your risk of heart disease. A Pritikin lifestyle can help to reduce your risk of inflammation and atherosclerosis (cholesterol build-up, or plaque, in the artery walls). Lifestyle interventions such as dietary choices and physical activity address the cause of atherosclerosis. A review of the types of fats and their impact on blood cholesterol levels, along with dietary recommendations to reduce fat intake is also included.  Nutrition Action Plan  Clinical staff conducted group or individual video education with verbal and written material and guidebook.  Patient learns how to incorporate Pritikin recommendations into their lifestyle. Recommendations include planning and keeping personal health goals in mind as an important part of their success.  Healthy Mind-Set    Healthy Minds, Bodies, Hearts  Clinical staff conducted group or individual video education with verbal and written material and guidebook.  Patient learns how to identify when they are stressed. Video will discuss the impact of that stress, as well as the many benefits of stress management. Patient will also be introduced to stress management techniques. The way we think, act, and feel has an impact on our hearts.  How Our Thoughts Can Heal Our Hearts  Clinical staff conducted group or individual video education with verbal and written material and guidebook.  Patient learns that negative thoughts can cause depression and anxiety. This can result in negative lifestyle behavior and serious health problems. Cognitive behavioral therapy is an effective method to help control our thoughts in order to change and improve our emotional outlook.  Additional Videos:  Exercise    Improving Performance  Clinical staff conducted group or individual video education with verbal and written material and guidebook.  Patient learns to use a non-linear approach by alternating intensity levels and lengths of time spent exercising to help burn more  calories and lose more body fat. Cardiovascular exercise helps improve heart health, metabolism, hormonal balance, blood sugar control, and recovery from fatigue. Resistance training improves strength, endurance, balance, coordination, reaction time, metabolism, and muscle mass. Flexibility exercise improves circulation, posture, and balance. Seek guidance from your physician and exercise physiologist before implementing an exercise routine and learn your capabilities and proper form  for all exercise.  Introduction to Yoga  Clinical staff conducted group or individual video education with verbal and written material and guidebook.  Patient learns about yoga, a discipline of the coming together of mind, breath, and body. The benefits of yoga include improved flexibility, improved range of motion, better posture and core strength, increased lung function, weight loss, and positive self-image. Yoga's heart health benefits include lowered blood pressure, healthier heart rate, decreased cholesterol and triglyceride levels, improved immune function, and reduced stress. Seek guidance from your physician and exercise physiologist before implementing an exercise routine and learn your capabilities and proper form for all exercise.  Medical   Aging: Enhancing Your Quality of Life  Clinical staff conducted group or individual video education with verbal and written material and guidebook.  Patient learns key strategies and recommendations to stay in good physical health and enhance quality of life, such as prevention strategies, having an advocate, securing a Health Care Proxy and Power of Attorney, and keeping a list of medications and system for tracking them. It also discusses how to avoid risk for bone loss.  Biology of Weight Control  Clinical staff conducted group or individual video education with verbal and written material and guidebook.  Patient learns that weight gain occurs because we consume more  calories than we burn (eating more, moving less). Even if your body weight is normal, you may have higher ratios of fat compared to muscle mass. Too much body fat puts you at increased risk for cardiovascular disease, heart attack, stroke, type 2 diabetes, and obesity-related cancers. In addition to exercise, following the Pritikin Eating Plan can help reduce your risk.  Decoding Lab Results  Clinical staff conducted group or individual video education with verbal and written material and guidebook.  Patient learns that lab test reflects one measurement whose values change over time and are influenced by many factors, including medication, stress, sleep, exercise, food, hydration, pre-existing medical conditions, and more. It is recommended to use the knowledge from this video to become more involved with your lab results and evaluate your numbers to speak with your doctor.   Diseases of Our Time - Overview  Clinical staff conducted group or individual video education with verbal and written material and guidebook.  Patient learns that according to the CDC, 50% to 70% of chronic diseases (such as obesity, type 2 diabetes, elevated lipids, hypertension, and heart disease) are avoidable through lifestyle improvements including healthier food choices, listening to satiety cues, and increased physical activity.  Sleep Disorders Clinical staff conducted group or individual video education with verbal and written material and guidebook.  Patient learns how good quality and duration of sleep are important to overall health and well-being. Patient also learns about sleep disorders and how they impact health along with recommendations to address them, including discussing with a physician.  Nutrition  Dining Out - Part 2 Clinical staff conducted group or individual video education with verbal and written material and guidebook.  Patient learns how to plan ahead and communicate in order to maximize their  dining experience in a healthy and nutritious manner. Included are recommended food choices based on the type of restaurant the patient is visiting.   Fueling a Banker conducted group or individual video education with verbal and written material and guidebook.  There is a strong connection between our food choices and our health. Diseases like obesity and type 2 diabetes are very prevalent and are in large-part due to lifestyle choices.  The Pritikin Eating Plan provides plenty of food and hunger-curbing satisfaction. It is easy to follow, affordable, and helps reduce health risks.  Menu Workshop  Clinical staff conducted group or individual video education with verbal and written material and guidebook.  Patient learns that restaurant meals can sabotage health goals because they are often packed with calories, fat, sodium, and sugar. Recommendations include strategies to plan ahead and to communicate with the manager, chef, or server to help order a healthier meal.  Planning Your Eating Strategy  Clinical staff conducted group or individual video education with verbal and written material and guidebook.  Patient learns about the Pritikin Eating Plan and its benefit of reducing the risk of disease. The Pritikin Eating Plan does not focus on calories. Instead, it emphasizes high-quality, nutrient-rich foods. By knowing the characteristics of the foods, we choose, we can determine their calorie density and make informed decisions.  Targeting Your Nutrition Priorities  Clinical staff conducted group or individual video education with verbal and written material and guidebook.  Patient learns that lifestyle habits have a tremendous impact on disease risk and progression. This video provides eating and physical activity recommendations based on your personal health goals, such as reducing LDL cholesterol, losing weight, preventing or controlling type 2 diabetes, and reducing high  blood pressure.  Vitamins and Minerals  Clinical staff conducted group or individual video education with verbal and written material and guidebook.  Patient learns different ways to obtain key vitamins and minerals, including through a recommended healthy diet. It is important to discuss all supplements you take with your doctor.   Healthy Mind-Set    Smoking Cessation  Clinical staff conducted group or individual video education with verbal and written material and guidebook.  Patient learns that cigarette smoking and tobacco addiction pose a serious health risk which affects millions of people. Stopping smoking will significantly reduce the risk of heart disease, lung disease, and many forms of cancer. Recommended strategies for quitting are covered, including working with your doctor to develop a successful plan.  Culinary   Becoming a Set designer conducted group or individual video education with verbal and written material and guidebook.  Patient learns that cooking at home can be healthy, cost-effective, quick, and puts them in control. Keys to cooking healthy recipes will include looking at your recipe, assessing your equipment needs, planning ahead, making it simple, choosing cost-effective seasonal ingredients, and limiting the use of added fats, salts, and sugars.  Cooking - Breakfast and Snacks  Clinical staff conducted group or individual video education with verbal and written material and guidebook.  Patient learns how important breakfast is to satiety and nutrition through the entire day. Recommendations include key foods to eat during breakfast to help stabilize blood sugar levels and to prevent overeating at meals later in the day. Planning ahead is also a key component.  Cooking - Educational psychologist conducted group or individual video education with verbal and written material and guidebook.  Patient learns eating strategies to improve overall  health, including an approach to cook more at home. Recommendations include thinking of animal protein as a side on your plate rather than center stage and focusing instead on lower calorie dense options like vegetables, fruits, whole grains, and plant-based proteins, such as beans. Making sauces in large quantities to freeze for later and leaving the skin on your vegetables are also recommended to maximize your experience.  Cooking - Healthy Salads and Dressing Clinical staff  conducted group or individual video education with verbal and written material and guidebook.  Patient learns that vegetables, fruits, whole grains, and legumes are the foundations of the Pritikin Eating Plan. Recommendations include how to incorporate each of these in flavorful and healthy salads, and how to create homemade salad dressings. Proper handling of ingredients is also covered. Cooking - Soups and [employer] - Soups and Desserts Clinical staff conducted group or individual video education with verbal and written material and guidebook.  Patient learns that Pritikin soups and desserts make for easy, nutritious, and delicious snacks and meal components that are low in sodium, fat, sugar, and calorie density, while high in vitamins, minerals, and filling fiber. Recommendations include simple and healthy ideas for soups and desserts.   Overview     The Pritikin Solution Program Overview Clinical staff conducted group or individual video education with verbal and written material and guidebook.  Patient learns that the results of the Pritikin Program have been documented in more than 100 articles published in peer-reviewed journals, and the benefits include reducing risk factors for (and, in some cases, even reversing) high cholesterol, high blood pressure, type 2 diabetes, obesity, and more! An overview of the three key pillars of the Pritikin Program will be covered: eating well, doing regular exercise, and having a  healthy mind-set.  WORKSHOPS  Exercise: Exercise Basics: Building Your Action Plan Clinical staff led group instruction and group discussion with PowerPoint presentation and patient guidebook. To enhance the learning environment the use of posters, models and videos may be added. At the conclusion of this workshop, patients will comprehend the difference between physical activity and exercise, as well as the benefits of incorporating both, into their routine. Patients will understand the FITT (Frequency, Intensity, Time, and Type) principle and how to use it to build an exercise action plan. In addition, safety concerns and other considerations for exercise and cardiac rehab will be addressed by the presenter. The purpose of this lesson is to promote a comprehensive and effective weekly exercise routine in order to improve patients' overall level of fitness.   Managing Heart Disease: Your Path to a Healthier Heart Clinical staff led group instruction and group discussion with PowerPoint presentation and patient guidebook. To enhance the learning environment the use of posters, models and videos may be added.At the conclusion of this workshop, patients will understand the anatomy and physiology of the heart. Additionally, they will understand how Pritikin's three pillars impact the risk factors, the progression, and the management of heart disease.  The purpose of this lesson is to provide a high-level overview of the heart, heart disease, and how the Pritikin lifestyle positively impacts risk factors.  Exercise Biomechanics Clinical staff led group instruction and group discussion with PowerPoint presentation and patient guidebook. To enhance the learning environment the use of posters, models and videos may be added. Patients will learn how the structural parts of their bodies function and how these functions impact their daily activities, movement, and exercise. Patients will learn how to  promote a neutral spine, learn how to manage pain, and identify ways to improve their physical movement in order to promote healthy living. The purpose of this lesson is to expose patients to common physical limitations that impact physical activity. Participants will learn practical ways to adapt and manage aches and pains, and to minimize their effect on regular exercise. Patients will learn how to maintain good posture while sitting, walking, and lifting.  Location manager and Fall Prevention  Clinical staff led group instruction and group discussion with PowerPoint presentation and patient guidebook. To enhance the learning environment the use of posters, models and videos may be added. At the conclusion of this workshop, patients will understand the importance of their sensorimotor skills (vision, proprioception, and the vestibular system) in maintaining their ability to balance as they age. Patients will apply a variety of balancing exercises that are appropriate for their current level of function. Patients will understand the common causes for poor balance, possible solutions to these problems, and ways to modify their physical environment in order to minimize their fall risk. The purpose of this lesson is to teach patients about the importance of maintaining balance as they age and ways to minimize their risk of falling.  WORKSHOPS   Nutrition:  Fueling a Ship broker led group instruction and group discussion with PowerPoint presentation and patient guidebook. To enhance the learning environment the use of posters, models and videos may be added. Patients will review the foundational principles of the Pritikin Eating Plan and understand what constitutes a serving size in each of the food groups. Patients will also learn Pritikin-friendly foods that are better choices when away from home and review make-ahead meal and snack options. Calorie density will be reviewed and  applied to three nutrition priorities: weight maintenance, weight loss, and weight gain. The purpose of this lesson is to reinforce (in a group setting) the key concepts around what patients are recommended to eat and how to apply these guidelines when away from home by planning and selecting Pritikin-friendly options. Patients will understand how calorie density may be adjusted for different weight management goals.  Mindful Eating  Clinical staff led group instruction and group discussion with PowerPoint presentation and patient guidebook. To enhance the learning environment the use of posters, models and videos may be added. Patients will briefly review the concepts of the Pritikin Eating Plan and the importance of low-calorie dense foods. The concept of mindful eating will be introduced as well as the importance of paying attention to internal hunger signals. Triggers for non-hunger eating and techniques for dealing with triggers will be explored. The purpose of this lesson is to provide patients with the opportunity to review the basic principles of the Pritikin Eating Plan, discuss the value of eating mindfully and how to measure internal cues of hunger and fullness using the Hunger Scale. Patients will also discuss reasons for non-hunger eating and learn strategies to use for controlling emotional eating.  Targeting Your Nutrition Priorities Clinical staff led group instruction and group discussion with PowerPoint presentation and patient guidebook. To enhance the learning environment the use of posters, models and videos may be added. Patients will learn how to determine their genetic susceptibility to disease by reviewing their family history. Patients will gain insight into the importance of diet as part of an overall healthy lifestyle in mitigating the impact of genetics and other environmental insults. The purpose of this lesson is to provide patients with the opportunity to assess their personal  nutrition priorities by looking at their family history, their own health history and current risk factors. Patients will also be able to discuss ways of prioritizing and modifying the Pritikin Eating Plan for their highest risk areas  Menu  Clinical staff led group instruction and group discussion with PowerPoint presentation and patient guidebook. To enhance the learning environment the use of posters, models and videos may be added. Using menus brought in from local restaurants, or printed from  online menus, patients will apply the Pritikin dining out guidelines that were presented in the [employer] video. Patients will also be able to practice these guidelines in a variety of provided scenarios. The purpose of this lesson is to provide patients with the opportunity to practice hands-on learning of the Pritikin Dining Out guidelines with actual menus and practice scenarios.  Label Reading Clinical staff led group instruction and group discussion with PowerPoint presentation and patient guidebook. To enhance the learning environment the use of posters, models and videos may be added. Patients will review and discuss the Pritikin label reading guidelines presented in Pritikin's Label Reading Educational series video. Using fool labels brought in from local grocery stores and markets, patients will apply the label reading guidelines and determine if the packaged food meet the Pritikin guidelines. The purpose of this lesson is to provide patients with the opportunity to review, discuss, and practice hands-on learning of the Pritikin Label Reading guidelines with actual packaged food labels. Cooking School  Pritikin's [employer] are designed to teach patients ways to prepare quick, simple, and affordable recipes at home. The importance of nutrition's role in chronic disease risk reduction is reflected in its emphasis in the overall Pritikin program. By learning how to prepare  essential core Pritikin Eating Plan recipes, patients will increase control over what they eat; be able to customize the flavor of foods without the use of added salt, sugar, or fat; and improve the quality of the food they consume. By learning a set of core recipes which are easily assembled, quickly prepared, and affordable, patients are more likely to prepare more healthy foods at home. These workshops focus on convenient breakfasts, simple entres, side dishes, and desserts which can be prepared with minimal effort and are consistent with nutrition recommendations for cardiovascular risk reduction. Cooking [employer] are taught by a Armed forces logistics/support/administrative officer (RD) who has been trained by the [employer]. The chef or RD has a clear understanding of the importance of minimizing - if not completely eliminating - added fat, sugar, and sodium in recipes. Throughout the series of Cooking School Workshop sessions, patients will learn about healthy ingredients and efficient methods of cooking to build confidence in their capability to prepare    Cooking School weekly topics:  Adding Flavor- Sodium-Free  Fast and Healthy Breakfasts  Powerhouse Plant-Based Proteins  Satisfying Salads and Dressings  Simple Sides and Sauces  International Cuisine-Spotlight on the [employer] Zones  Delicious Desserts  Savory Soups  [employer] - Meals in a Astronomer Appetizers and Snacks  Comforting Weekend Breakfasts  One-Pot Wonders   Fast Evening Meals  Landscape architect Your Pritikin Plate  WORKSHOPS   Healthy Mindset (Psychosocial):  Focused Goals, Sustainable Changes Clinical staff led group instruction and group discussion with PowerPoint presentation and patient guidebook. To enhance the learning environment the use of posters, models and videos may be added. Patients will be able to apply effective goal setting strategies to establish at least one personal goal, and  then take consistent, meaningful action toward that goal. They will learn to identify common barriers to achieving personal goals and develop strategies to overcome them. Patients will also gain an understanding of how our mind-set can impact our ability to achieve goals and the importance of cultivating a positive and growth-oriented mind-set. The purpose of this lesson is to provide patients with a deeper understanding of how to set and achieve personal goals, as well  as the tools and strategies needed to overcome common obstacles which may arise along the way.  From Head to Heart: The Power of a Healthy Outlook  Clinical staff led group instruction and group discussion with PowerPoint presentation and patient guidebook. To enhance the learning environment the use of posters, models and videos may be added. Patients will be able to recognize and describe the impact of emotions and mood on physical health. They will discover the importance of self-care and explore self-care practices which may work for them. Patients will also learn how to utilize the 4 C's to cultivate a healthier outlook and better manage stress and challenges. The purpose of this lesson is to demonstrate to patients how a healthy outlook is an essential part of maintaining good health, especially as they continue their cardiac rehab journey.  Healthy Sleep for a Healthy Heart Clinical staff led group instruction and group discussion with PowerPoint presentation and patient guidebook. To enhance the learning environment the use of posters, models and videos may be added. At the conclusion of this workshop, patients will be able to demonstrate knowledge of the importance of sleep to overall health, well-being, and quality of life. They will understand the symptoms of, and treatments for, common sleep disorders. Patients will also be able to identify daytime and nighttime behaviors which impact sleep, and they will be able to apply these  tools to help manage sleep-related challenges. The purpose of this lesson is to provide patients with a general overview of sleep and outline the importance of quality sleep. Patients will learn about a few of the most common sleep disorders. Patients will also be introduced to the concept of "sleep hygiene," and discover ways to self-manage certain sleeping problems through simple daily behavior changes. Finally, the workshop will motivate patients by clarifying the links between quality sleep and their goals of heart-healthy living.   Recognizing and Reducing Stress Clinical staff led group instruction and group discussion with PowerPoint presentation and patient guidebook. To enhance the learning environment the use of posters, models and videos may be added. At the conclusion of this workshop, patients will be able to understand the types of stress reactions, differentiate between acute and chronic stress, and recognize the impact that chronic stress has on their health. They will also be able to apply different coping mechanisms, such as reframing negative self-talk. Patients will have the opportunity to practice a variety of stress management techniques, such as deep abdominal breathing, progressive muscle relaxation, and/or guided imagery.  The purpose of this lesson is to educate patients on the role of stress in their lives and to provide healthy techniques for coping with it.  Learning Barriers/Preferences:  Learning Barriers/Preferences - 06/03/24 1131       Learning Barriers/Preferences   Learning Barriers Sight    Learning Preferences Audio;Computer/Internet;Group Instruction;Individual Instruction;Pictoral;Skilled Demonstration;Verbal Instruction;Video;Written Material          Education Topics:  Knowledge Questionnaire Score:  Knowledge Questionnaire Score - 06/03/24 1132       Knowledge Questionnaire Score   Pre Score 23/24          Core Components/Risk Factors/Patient  Goals at Admission:  Personal Goals and Risk Factors at Admission - 06/03/24 1135       Core Components/Risk Factors/Patient Goals on Admission    Weight Management Obesity;Weight Loss    Diabetes Yes    Intervention Provide education about signs/symptoms and action to take for hypo/hyperglycemia.;Provide education about proper nutrition, including hydration, and aerobic/resistive exercise prescription  along with prescribed medications to achieve blood glucose in normal ranges: Fasting glucose 65-99 mg/dL    Expected Outcomes Short Term: Participant verbalizes understanding of the signs/symptoms and immediate care of hyper/hypoglycemia, proper foot care and importance of medication, aerobic/resistive exercise and nutrition plan for blood glucose control.;Long Term: Attainment of HbA1C < 7%.    Hypertension Yes    Intervention Monitor prescription use compliance.;Provide education on lifestyle modifcations including regular physical activity/exercise, weight management, moderate sodium restriction and increased consumption of fresh fruit, vegetables, and low fat dairy, alcohol moderation, and smoking cessation.    Expected Outcomes Short Term: Continued assessment and intervention until BP is < 140/73mm HG in hypertensive participants. < 130/30mm HG in hypertensive participants with diabetes, heart failure or chronic kidney disease.;Long Term: Maintenance of blood pressure at goal levels.    Lipids Yes    Intervention Provide education and support for participant on nutrition & aerobic/resistive exercise along with prescribed medications to achieve LDL 70mg , HDL >40mg .    Expected Outcomes Short Term: Participant states understanding of desired cholesterol values and is compliant with medications prescribed. Participant is following exercise prescription and nutrition guidelines.;Long Term: Cholesterol controlled with medications as prescribed, with individualized exercise RX and with personalized  nutrition plan. Value goals: LDL < 70mg , HDL > 40 mg.    Stress Yes    Intervention Offer individual and/or small group education and counseling on adjustment to heart disease, stress management and health-related lifestyle change. Teach and support self-help strategies.;Refer participants experiencing significant psychosocial distress to appropriate mental health specialists for further evaluation and treatment. When possible, include family members and significant others in education/counseling sessions.    Expected Outcomes Short Term: Participant demonstrates changes in health-related behavior, relaxation and other stress management skills, ability to obtain effective social support, and compliance with psychotropic medications if prescribed.;Long Term: Emotional wellbeing is indicated by absence of clinically significant psychosocial distress or social isolation.          Core Components/Risk Factors/Patient Goals Review:   Goals and Risk Factor Review     Row Name 06/11/24 1714             Core Components/Risk Factors/Patient Goals Review   Personal Goals Review Weight Management/Obesity;Stress;Hypertension;Diabetes;Lipids       Review Kathleen Reid started cardiac rehab on 06/11/24. Kathleen Reid did well with exercise. Vital signs were stable.       Expected Outcomes Kathleen Reid will continue to participate in cardiac rehab for exercise, nutrition and lifestyle modifications.          Core Components/Risk Factors/Patient Goals at Discharge (Final Review):   Goals and Risk Factor Review - 06/11/24 1714       Core Components/Risk Factors/Patient Goals Review   Personal Goals Review Weight Management/Obesity;Stress;Hypertension;Diabetes;Lipids    Review Kathleen Reid started cardiac rehab on 06/11/24. Kathleen Reid did well with exercise. Vital signs were stable.    Expected Outcomes Kathleen Reid will continue to participate in cardiac rehab for exercise, nutrition and lifestyle modifications.          ITP  Comments:  ITP Comments     Row Name 06/03/24 1007 06/11/24 1708         ITP Comments Wilbert Holland, MD:  Medical Director.  Intorduction to the [facility] / Intensive Cardiac Rehab.  Initial orientation packet reviewed with the patinet. 30 Day ITP Review. Mariavictoria started cardiac rehab on 06/11/24. Marianny did well with exercise.         Comments: See ITP comments.

## 2024-06-18 ENCOUNTER — Encounter (HOSPITAL_COMMUNITY)
Admission: RE | Admit: 2024-06-18 | Discharge: 2024-06-18 | Disposition: A | Discharge status: Home / Self Care | Source: Ambulatory Visit | Attending: Internal Medicine | Admitting: Internal Medicine

## 2024-06-18 DIAGNOSIS — Z955 Presence of coronary angioplasty implant and graft: Secondary | ICD-10-CM | POA: Diagnosis not present

## 2024-06-23 ENCOUNTER — Encounter (HOSPITAL_COMMUNITY): Admission: RE | Admit: 2024-06-23 | Source: Ambulatory Visit

## 2024-06-24 ENCOUNTER — Ambulatory Visit (HOSPITAL_COMMUNITY)
Admission: RE | Admit: 2024-06-24 | Discharge: 2024-06-24 | Disposition: A | Discharge status: Home / Self Care | Source: Ambulatory Visit | Attending: Cardiology | Admitting: Cardiology

## 2024-06-24 ENCOUNTER — Ambulatory Visit: Payer: Self-pay | Admitting: Internal Medicine

## 2024-06-24 DIAGNOSIS — I2510 Atherosclerotic heart disease of native coronary artery without angina pectoris: Secondary | ICD-10-CM | POA: Insufficient documentation

## 2024-06-24 LAB — ECHOCARDIOGRAM COMPLETE
Area-P 1/2: 3.42 cm2
S' Lateral: 2.9 cm

## 2024-06-25 ENCOUNTER — Encounter (HOSPITAL_COMMUNITY): Admission: RE | Admit: 2024-06-25 | Source: Ambulatory Visit

## 2024-06-30 ENCOUNTER — Encounter (HOSPITAL_COMMUNITY)
Admission: RE | Admit: 2024-06-30 | Discharge: 2024-06-30 | Disposition: A | Discharge status: Home / Self Care | Source: Ambulatory Visit | Attending: Internal Medicine

## 2024-06-30 DIAGNOSIS — Z955 Presence of coronary angioplasty implant and graft: Secondary | ICD-10-CM

## 2024-07-01 ENCOUNTER — Ambulatory Visit (HOSPITAL_COMMUNITY)
Admission: EM | Admit: 2024-07-01 | Discharge: 2024-07-01 | Disposition: A | Discharge status: Home / Self Care | Attending: Internal Medicine | Admitting: Internal Medicine

## 2024-07-01 ENCOUNTER — Encounter (HOSPITAL_COMMUNITY): Payer: Self-pay | Admitting: Emergency Medicine

## 2024-07-01 DIAGNOSIS — N3000 Acute cystitis without hematuria: Secondary | ICD-10-CM | POA: Insufficient documentation

## 2024-07-01 DIAGNOSIS — R300 Dysuria: Secondary | ICD-10-CM | POA: Diagnosis present

## 2024-07-01 LAB — POCT URINALYSIS DIP (MANUAL ENTRY)
Bilirubin, UA: NEGATIVE
Blood, UA: NEGATIVE
Glucose, UA: NEGATIVE mg/dL
Ketones, POC UA: NEGATIVE mg/dL
Nitrite, UA: POSITIVE — AB
Protein Ur, POC: 30 mg/dL — AB
Spec Grav, UA: 1.025 (ref 1.010–1.025)
Urobilinogen, UA: 2 U/dL — AB
pH, UA: 6 (ref 5.0–8.0)

## 2024-07-01 MED ORDER — CEPHALEXIN 500 MG PO CAPS: 500.0000 mg | ORAL_CAPSULE | Freq: Two times a day (BID) | ORAL | 0 refills | Status: AC

## 2024-07-01 NOTE — ED Triage Notes (Signed)
 Pt c/o lower back pain since Saturday. Reports will have a sting when urinates. Denies any bowel problems. Taking tylenol  and heat but not helping. \ Denies fall or injury.

## 2024-07-01 NOTE — ED Provider Notes (Signed)
 [facility]    CSN: [id number] Arrival date & time: 07/01/24  9191      History   Chief Complaint Chief Complaint  Patient presents with   Back Pain    HPI Kathleen Reid is a 68 y.o. female.   Kathleen Reid is a 68 y.o. female presenting for chief complaint of stinging/burning bilateral low back pain, urinary frequency, urinary urgency, and dysuria that started 5 days ago.  Bilateral lower back pain intermittently radiates around the sides of the abdomen into the bilateral lower abdomen.  She walks for exercise and has increased her walking recently, she is unsure if low back pain is due to increased exercise versus urinary tract infection given urinary symptoms.  She is a type II diabetic and states her sugars have been well-controlled.  She does not take an SGLT2 inhibitor.  Denies gross hematuria, nausea, vomiting, diarrhea, dizziness, headache, flank pain, vaginal symptoms, and saddle anesthesia/radicular pain to the bilateral lower extremities.  No lower extremity weakness or midline low back pain reported.  Denies recent antibiotic or steroid use.  She does not drink urinary irritants often, mostly drinks water and has 1 cup of coffee every so often.  She has not attempted use of any over-the-counter medications to help with symptoms PTA.    Back Pain   Past Medical History:  Diagnosis Date   Diabetes mellitus without complication (HCC)    Eye disease    HLD (hyperlipidemia)     Patient Active Problem List   Diagnosis Date Noted   Upper airway cough syndrome 10/10/2019   Hyperlipidemia 11/05/2014   Type II or unspecified type diabetes mellitus without mention of complication, not stated as uncontrolled 07/15/2013    Past Surgical History:  Procedure Laterality Date   ABDOMINAL HYSTERECTOMY     BREAST BIOPSY     KNEE SURGERY     REDUCTION MAMMAPLASTY      OB History   No obstetric history on file.      Home Medications    Prior to  Admission medications   Medication Sig Start Date End Date Taking? Authorizing Provider  cephALEXin  (KEFLEX ) 500 MG capsule Take 1 capsule (500 mg total) by mouth 2 (two) times daily for 7 days. 07/01/24 07/08/24 Yes Enedelia Dorna HERO, FNP  ACCU-CHEK SMARTVIEW test strip TEST three times a day 04/11/16   Von Pacific, MD  aspirin 81 MG chewable tablet Chew 81 mg by mouth daily.    [provider]  clobetasol (TEMOVATE) 0.05 % external solution APP 1 ML EXT AA Q NIGHT Patient not taking: Reported on 06/03/2024 09/03/19   [provider]  clopidogrel  (PLAVIX ) 75 MG tablet Take 1 tablet (75 mg total) by mouth daily. 05/29/24   Thukkani, Arun K, MD  ergocalciferol (VITAMIN D2) 1.25 MG (50000 UT) capsule Take 50,000 Units by mouth once a week.    [provider]  glimepiride (AMARYL) 2 MG tablet Take 2 mg by mouth every morning.    [provider]  hydrochlorothiazide (HYDRODIURIL) 12.5 MG tablet Take 12.5 mg by mouth daily.    [provider]  Insulin  Pen Needle (BD PEN NEEDLE NANO U/F) 32G X 4 MM MISC Use one per day 03/15/16   Von Pacific, MD  isosorbide  mononitrate (IMDUR ) 30 MG 24 hr tablet Take 1 tablet (30 mg total) by mouth daily. 05/29/24 08/27/24  Thukkani, Arun K, MD  losartan  (COZAAR ) 25 MG tablet Take 1 tablet (25 mg total) by mouth  daily. 05/09/24   Thukkani, Arun K, MD  metoprolol tartrate (LOPRESSOR) 25 MG tablet Take 25 mg by mouth 2 (two) times daily. 12/30/23   [provider]  nitroGLYCERIN  (NITROSTAT ) 0.4 MG SL tablet Place 1 tablet (0.4 mg total) under the tongue every 5 (five) minutes as needed. 05/09/24   Thukkani, Arun K, MD  rosuvastatin  (CRESTOR ) 20 MG tablet Take 1 tablet (20 mg total) by mouth daily. 05/09/24   Thukkani, Arun K, MD  tirzepatide (MOUNJARO) 2.5 MG/0.5ML Pen Inject 7.5 mg into the skin once a week. Pt states this was increased to 7.5 mg on 05/30/2024    [provider]    Family History Family History  Problem  Relation Age of Onset   Diabetes Mother    Heart disease Mother    Diabetes Father    Heart disease Father    Diabetes Sister    Heart disease Sister     Social History Social History   Tobacco Use   Smoking status: Never   Smokeless tobacco: Never  Vaping Use   Vaping status: Never Used  Substance Use Topics   Alcohol use: No   Drug use: No     Allergies   Codeine, Jardiance [empagliflozin], and Latex   Review of Systems Review of Systems  Musculoskeletal:  Positive for back pain.  Per HPI   Physical Exam Triage Vital Signs ED Triage Vitals  Encounter Vitals Group     BP 07/01/24 0835 128/79     Girls Systolic BP Percentile --      Girls Diastolic BP Percentile --      Boys Systolic BP Percentile --      Boys Diastolic BP Percentile --      Pulse Rate 07/01/24 0835 75     Resp 07/01/24 0835 16     Temp 07/01/24 0835 98.1 F (36.7 C)     Temp Source 07/01/24 0835 Oral     SpO2 07/01/24 0835 96 %     Weight --      Height --      Head Circumference --      Peak Flow --      Pain Score 07/01/24 0834 8     Pain Loc --      Pain Education --      Exclude from Growth Chart --    No data found.  Updated Vital Signs BP 128/79 (BP Location: Left Arm)   Pulse 75   Temp 98.1 F (36.7 C) (Oral)   Resp 16   SpO2 96%   Visual Acuity Right Eye Distance:   Left Eye Distance:   Bilateral Distance:    Right Eye Near:   Left Eye Near:    Bilateral Near:     Physical Exam Vitals and nursing note reviewed.  Constitutional:      Appearance: She is not ill-appearing or toxic-appearing.  HENT:     Head: Normocephalic and atraumatic.     Right Ear: Hearing and external ear normal.     Left Ear: Hearing and external ear normal.     Nose: Nose normal.     Mouth/Throat:     Lips: Pink.  Eyes:     General: Lids are normal. Vision grossly intact. Gaze aligned appropriately.     Extraocular Movements: Extraocular movements intact.     Conjunctiva/sclera:  Conjunctivae normal.  Pulmonary:     Effort: Pulmonary effort is normal.  Abdominal:     General: Bowel  sounds are normal.     Palpations: Abdomen is soft.     Tenderness: There is no abdominal tenderness. There is no right CVA tenderness, left CVA tenderness or guarding.  Musculoskeletal:     Cervical back: Normal and neck supple.     Thoracic back: Normal.     Lumbar back: Tenderness (Tender to palpation over the bilateral lumbar paraspinous muscles, non-tender to midline spine. No overlying rash.) present. No swelling, edema, deformity, signs of trauma, lacerations, spasms or bony tenderness. Normal range of motion. Negative right straight leg raise test and negative left straight leg raise test. No scoliosis.  Skin:    General: Skin is warm and dry.     Capillary Refill: Capillary refill takes less than 2 seconds.     Findings: No rash.  Neurological:     General: No focal deficit present.     Mental Status: She is alert and oriented to person, place, and time. Mental status is at baseline.     Cranial Nerves: No dysarthria or facial asymmetry.  Psychiatric:        Mood and Affect: Mood normal.        Speech: Speech normal.        Behavior: Behavior normal.        Thought Content: Thought content normal.        Judgment: Judgment normal.      UC Treatments / Results  Labs (all labs ordered are listed, but only abnormal results are displayed) Labs Reviewed  POCT URINALYSIS DIP (MANUAL ENTRY) - Abnormal; Notable for the following components:      Result Value   Clarity, UA cloudy (*)    Protein Ur, POC =30 (*)    Urobilinogen, UA 2.0 (*)    Nitrite, UA Positive (*)    Leukocytes, UA Small (1+) (*)    All other components within normal limits  URINE CULTURE    EKG   Radiology No results found.  Procedures Procedures (including critical care time)  Medications Ordered in UC Medications - No data to display  Initial Impression / Assessment and Plan / UC Course   I have reviewed the triage vital signs and the nursing notes.  Pertinent labs & imaging results that were available during my care of the patient were reviewed by me and considered in my medical decision making (see chart for details).   1.  Dysuria, acute cystitis without hematuria Evaluation suggests acute cystitis based on presentation and urinalysis findings in clinic.  Urine culture pending.  Low suspicion for acute pyelonephritis, kidney stone or infected stone.  Keflex  antibiotic ordered. No allergies to antibiotics.  Appears well hydrated, therefore will defer labs/imaging.  Vitals stable.  Patient to push fluids to stay well hydrated and reduce intake of known urinary irritants.  Discussed methods of preventing future UTI.   Counseled patient on potential for adverse effects with medications prescribed/recommended today, strict ER and return-to-clinic precautions discussed, patient verbalized understanding.    Final Clinical Impressions(s) / UC Diagnoses   Final diagnoses:  Dysuria  Acute cystitis without hematuria     Discharge Instructions      Your urine shows you likely have a urinary tract infection.  I have sent your urine for culture to confirm this.  We will call you if we need to change your antibiotic when we find out the type of bacteria growing in your bladder.  Take antibiotic as directed with a snack/food to avoid stomach upset. To avoid GI  upset please take this medication with food.   Avoid drinking beverages that irritate the urinary tract like sodas, tea, coffee, or juice. Drink plenty of water to stay well hydrated and prevent severe infection.  If you develop any new or worsening symptoms or if your symptoms do not start to improve, pleases return here or follow-up with your primary care provider. If your symptoms are severe, please go to the emergency room.     ED Prescriptions     Medication Sig Dispense Auth. Provider   cephALEXin   (KEFLEX ) 500 MG capsule Take 1 capsule (500 mg total) by mouth 2 (two) times daily for 7 days. 14 capsule Enedelia Dorna HERO, FNP      PDMP not reviewed this encounter.   Enedelia Dorna HERO, FNP 07/01/24 1200

## 2024-07-01 NOTE — Discharge Instructions (Addendum)

## 2024-07-02 ENCOUNTER — Encounter (HOSPITAL_COMMUNITY): Admission: RE | Admit: 2024-07-02 | Source: Ambulatory Visit

## 2024-07-03 LAB — URINE CULTURE: Culture: >=100000 — AB

## 2024-07-04 ENCOUNTER — Ambulatory Visit (HOSPITAL_COMMUNITY): Payer: Self-pay

## 2024-07-04 MED ORDER — NITROFURANTOIN MONOHYD MACRO 100 MG PO CAPS: 100.0000 mg | ORAL_CAPSULE | Freq: Two times a day (BID) | ORAL | 0 refills | Status: AC

## 2024-07-07 ENCOUNTER — Telehealth (HOSPITAL_COMMUNITY): Payer: Self-pay

## 2024-07-07 ENCOUNTER — Encounter (HOSPITAL_COMMUNITY): Admission: RE | Admit: 2024-07-07 | Source: Ambulatory Visit

## 2024-07-09 ENCOUNTER — Encounter (HOSPITAL_COMMUNITY)
Admission: RE | Admit: 2024-07-09 | Discharge: 2024-07-09 | Disposition: A | Discharge status: Home / Self Care | Source: Ambulatory Visit | Attending: Internal Medicine | Admitting: Internal Medicine

## 2024-07-09 DIAGNOSIS — Z955 Presence of coronary angioplasty implant and graft: Secondary | ICD-10-CM | POA: Insufficient documentation

## 2024-07-14 ENCOUNTER — Encounter (HOSPITAL_COMMUNITY)

## 2024-07-14 ENCOUNTER — Telehealth: Payer: Self-pay

## 2024-07-14 NOTE — Telephone Encounter (Signed)
 Pt called stating she is having continued symptoms after switching from Keflex  to Macrobid . States her urinary symptoms have mostly cleared, but still has low back pain, which says feels very much like a UTI. She denies tenderness, fever, n/v, and vaginal itching.  Per KYM Silk, FNP, Needs repeat urine/urine culture to check status of UTI to see if abx worked.  Advised pt of NP recs. Pt is currently out of town for the week. Advised she should go to a local urgent care and show her mychart results if needed. Verbalized understanding.

## 2024-07-15 ENCOUNTER — Encounter (HOSPITAL_COMMUNITY): Payer: Self-pay | Admitting: *Deleted

## 2024-07-15 DIAGNOSIS — Z955 Presence of coronary angioplasty implant and graft: Secondary | ICD-10-CM

## 2024-07-15 NOTE — Progress Notes (Signed)
 Cardiac Individual Treatment Plan  Patient Details  Name: ALISAH GRANDBERRY MRN: [id number] Date of Birth: [birth year] Referring Provider:   Flowsheet Row INTENSIVE CARDIAC REHAB ORIENT from 06/03/2024 in [facility]  Referring Provider Lurena Red, MD    Initial Encounter Date:  Flowsheet Row INTENSIVE CARDIAC REHAB ORIENT from 06/03/2024 in [facility]  Date 06/03/24    Visit Diagnosis: 02/26/24 Status post coronary artery stent placement  Patient's Home Medications on Admission:  Current Outpatient Medications:    ACCU-CHEK SMARTVIEW test strip, TEST three times a day, Disp: 100 each, Rfl: 2   aspirin 81 MG chewable tablet, Chew 81 mg by mouth daily., Disp: , Rfl:    clobetasol (TEMOVATE) 0.05 % external solution, APP 1 ML EXT AA Q NIGHT (Patient not taking: Reported on 06/03/2024), Disp: , Rfl:    clopidogrel  (PLAVIX ) 75 MG tablet, Take 1 tablet (75 mg total) by mouth daily., Disp: 90 tablet, Rfl: 3   ergocalciferol (VITAMIN D2) 1.25 MG (50000 UT) capsule, Take 50,000 Units by mouth once a week., Disp: , Rfl:    glimepiride (AMARYL) 2 MG tablet, Take 2 mg by mouth every morning., Disp: , Rfl:    hydrochlorothiazide (HYDRODIURIL) 12.5 MG tablet, Take 12.5 mg by mouth daily., Disp: , Rfl:    Insulin  Pen Needle (BD PEN NEEDLE NANO U/F) 32G X 4 MM MISC, Use one per day, Disp: 30 each, Rfl: 5   isosorbide  mononitrate (IMDUR ) 30 MG 24 hr tablet, Take 1 tablet (30 mg total) by mouth daily., Disp: 90 tablet, Rfl: 3   losartan  (COZAAR ) 25 MG tablet, Take 1 tablet (25 mg total) by mouth daily., Disp: 90 tablet, Rfl: 3   metoprolol tartrate (LOPRESSOR) 25 MG tablet, Take 25 mg by mouth 2 (two) times daily., Disp: , Rfl:    nitrofurantoin , macrocrystal-monohydrate, (MACROBID ) 100 MG capsule, Take 1 capsule (100 mg total) by mouth 2 (two) times daily., Disp: 10 capsule, Rfl: 0    nitroGLYCERIN  (NITROSTAT ) 0.4 MG SL tablet, Place 1 tablet (0.4 mg total) under the tongue every 5 (five) minutes as needed., Disp: 25 tablet, Rfl: 3   rosuvastatin  (CRESTOR ) 20 MG tablet, Take 1 tablet (20 mg total) by mouth daily., Disp: 90 tablet, Rfl: 3   tirzepatide (MOUNJARO) 2.5 MG/0.5ML Pen, Inject 7.5 mg into the skin once a week. Pt states this was increased to 7.5 mg on 05/30/2024, Disp: , Rfl:   Past Medical History: Past Medical History:  Diagnosis Date   Diabetes mellitus without complication (HCC)    Eye disease    HLD (hyperlipidemia)     Tobacco Use: Social History   Tobacco Use  Smoking Status Never  Smokeless Tobacco Never    Labs: Review Flowsheet  More data exists      Latest Ref Rng & Units 10/08/2014 05/17/2015 02/25/2016 09/11/2016 10/16/2016  Labs for ITP Cardiac and Pulmonary Rehab  Cholestrol 0 - 200 mg/dL - - - - 801   LDL (calc) 0 - 99 mg/dL - - - - 870   HDL-C >60.99 mg/dL - - - - 60.39   Trlycerides 0.0 - 149.0 mg/dL - - - - 856.9   Hemoglobin A1c - 7.8  6.7  9.8  10.7  -    Capillary Blood Glucose: Lab Results  Component Value Date   GLUCAP 150 (H) 06/11/2024   GLUCAP 152 (H) 06/11/2024   GLUCAP 286 (  H) 04/10/2011   GLUCAP 304 (H) 12/23/2010     Exercise Target Goals: Exercise Program Goal: Individual exercise prescription set using results from initial 6 min walk test and THRR while considering  patient's activity barriers and safety.   Exercise Prescription Goal: Initial exercise prescription builds to 30-45 minutes a day of aerobic activity, 2-3 days per week.  Home exercise guidelines will be given to patient during program as part of exercise prescription that the participant will acknowledge.  Activity Barriers & Risk Stratification:  Activity Barriers & Cardiac Risk Stratification - 06/03/24 1125       Activity Barriers & Cardiac Risk Stratification   Activity Barriers Arthritis;Balance Concerns;History of Falls;Joint  Problems;Other (comment)    Comments Pt has depth perception issues.    Cardiac Risk Stratification High          6 Minute Walk:  6 Minute Walk     Row Name 06/03/24 1050         6 Minute Walk   Phase Initial     Distance 1136 feet     Walk Time 6 minutes     # of Rest Breaks 0     MPH 2.15     METS 2.6     RPE 10     Perceived Dyspnea  0     VO2 Peak 9.07     Symptoms No     Resting HR 71 bpm     Resting BP 122/60     Resting Oxygen Saturation  97 %     Exercise Oxygen Saturation  during 6 min walk 95 %     Max Ex. HR 90 bpm     Max Ex. BP 150/64     2 Minute Post BP 124/62        Oxygen Initial Assessment:   Oxygen Re-Evaluation:   Oxygen Discharge (Final Oxygen Re-Evaluation):   Initial Exercise Prescription:  Initial Exercise Prescription - 06/03/24 1100       Date of Initial Exercise RX and Referring Provider   Date 06/03/24    Referring Provider Lurena Red, MD    Expected Discharge Date 08/27/24      Recumbant Bike   Level 1    RPM 60    Watts 17    Minutes 15    METs 2.6      NuStep   Level 2    SPM 75    Minutes 15    METs 2.6      Prescription Details   Frequency (times per week) 2   Pt will attend M/W   Duration Progress to 30 minutes of continuous aerobic without signs/symptoms of physical distress      Intensity   THRR 40-80% of Max Heartrate 61-122    Ratings of Perceived Exertion 11-13    Perceived Dyspnea 0-4      Progression   Progression Continue progressive overload as per policy without signs/symptoms or physical distress.      Resistance Training   Training Prescription Yes    Weight 3 lb    Reps 10-15          Perform Capillary Blood Glucose checks as needed.  Exercise Prescription Changes:   Exercise Prescription Changes     Row Name 06/11/24 1600 06/30/24 1030           Response to Exercise   Blood Pressure (Admit) 124/70 118/80      Blood Pressure (Exercise) 140/60 152/58  Blood  Pressure (Exit) 114/72 104/60      Heart Rate (Admit) 83 bpm 73 bpm      Heart Rate (Exercise) 113 bpm 114 bpm      Heart Rate (Exit) 86 bpm 89 bpm      Rating of Perceived Exertion (Exercise) 13 11      Symptoms None None      Comments Pt's first day in the CRP2 program Reviewed METs      Duration Continue with 30 min of aerobic exercise without signs/symptoms of physical distress. Continue with 30 min of aerobic exercise without signs/symptoms of physical distress.      Intensity THRR unchanged THRR unchanged        Progression   Progression Continue to progress workloads to maintain intensity without signs/symptoms of physical distress. Continue to progress workloads to maintain intensity without signs/symptoms of physical distress.      Average METs 2.4 2.95        Resistance Training   Training Prescription No Yes      Weight No weights on wednesdays 3 lbs      Reps -- 10-15      Time -- 5 Minutes        Interval Training   Interval Training No No        Recumbant Bike   Level 1 1      RPM 73 89      Watts 16 19      Minutes 17 15      METs 2 2.5        NuStep   Level 2 2      SPM -- 120      Minutes 15 15      METs 2.8 3.4         Exercise Comments:   Exercise Comments     Row Name 06/11/24 1648 06/30/24 1015         Exercise Comments Pt's first day in the CRP2 program. Pt exercised without complaints. Pt off to a good start. Reviewed METs with patient. Only 1 week worth of data to review but pt is making good progress. Pt had to go to [place] to be with daughter who is having a complicated pregnancy.         Exercise Goals and Review:   Exercise Goals     Row Name 06/03/24 1011             Exercise Goals   Increase Physical Activity Yes       Intervention Provide advice, education, support and counseling about physical activity/exercise needs.;Develop an individualized exercise prescription for aerobic and resistive training based on initial  evaluation findings, risk stratification, comorbidities and participant's personal goals.       Expected Outcomes Short Term: Attend rehab on a regular basis to increase amount of physical activity.;Long Term: Add in home exercise to make exercise part of routine and to increase amount of physical activity.;Long Term: Exercising regularly at least 3-5 days a week.       Increase Strength and Stamina Yes       Intervention Provide advice, education, support and counseling about physical activity/exercise needs.;Develop an individualized exercise prescription for aerobic and resistive training based on initial evaluation findings, risk stratification, comorbidities and participant's personal goals.       Expected Outcomes Short Term: Increase workloads from initial exercise prescription for resistance, speed, and METs.;Short Term: Perform resistance training exercises routinely during rehab and add  in resistance training at home;Long Term: Improve cardiorespiratory fitness, muscular endurance and strength as measured by increased METs and functional capacity ( )       Able to understand and use rate of perceived exertion (RPE) scale Yes       Intervention Provide education and explanation on how to use RPE scale       Expected Outcomes Short Term: Able to use RPE daily in rehab to express subjective intensity level;Long Term:  Able to use RPE to guide intensity level when exercising independently       Knowledge and understanding of Target Heart Rate Range (THRR) Yes       Intervention Provide education and explanation of THRR including how the numbers were predicted and where they are located for reference       Expected Outcomes Short Term: Able to state/look up THRR;Long Term: Able to use THRR to govern intensity when exercising independently;Short Term: Able to use daily as guideline for intensity in rehab       Understanding of Exercise Prescription Yes       Intervention Provide education,  explanation, and written materials on patient's individual exercise prescription       Expected Outcomes Short Term: Able to explain program exercise prescription;Long Term: Able to explain home exercise prescription to exercise independently          Exercise Goals Re-Evaluation :  Exercise Goals Re-Evaluation     Row Name 06/11/24 1647             Exercise Goal Re-Evaluation   Exercise Goals Review Increase Physical Activity;Understanding of Exercise Prescription;Increase Strength and Stamina;Knowledge and understanding of Target Heart Rate Range (THRR);Able to understand and use rate of perceived exertion (RPE) scale       Comments Pt's first day in the CRP2 program. Pt understands the exercise Rx, RPE sclae and THRR.       Expected Outcomes Will continue to monitor the patient and progress as tolerated.          Discharge Exercise Prescription (Final Exercise Prescription Changes):  Exercise Prescription Changes - 06/30/24 1030       Response to Exercise   Blood Pressure (Admit) 118/80    Blood Pressure (Exercise) 152/58    Blood Pressure (Exit) 104/60    Heart Rate (Admit) 73 bpm    Heart Rate (Exercise) 114 bpm    Heart Rate (Exit) 89 bpm    Rating of Perceived Exertion (Exercise) 11    Symptoms None    Comments Reviewed METs    Duration Continue with 30 min of aerobic exercise without signs/symptoms of physical distress.    Intensity THRR unchanged      Progression   Progression Continue to progress workloads to maintain intensity without signs/symptoms of physical distress.    Average METs 2.95      Resistance Training   Training Prescription Yes    Weight 3 lbs    Reps 10-15    Time 5 Minutes      Interval Training   Interval Training No      Recumbant Bike   Level 1    RPM 89    Watts 19    Minutes 15    METs 2.5      NuStep   Level 2    SPM 120    Minutes 15    METs 3.4          Nutrition:  Target Goals: Understanding of nutrition  guidelines,  daily intake of sodium 1500mg , cholesterol 200mg , calories 30% from fat and 7% or less from saturated fats, daily to have 5 or more servings of fruits and vegetables.  Biometrics:  Pre Biometrics - 06/03/24 0948       Pre Biometrics   Waist Circumference 40 inches    Hip Circumference 41.2 inches    Waist to Hip Ratio 0.97 %    Triceps Skinfold 20 mm    % Body Fat 40.3 %    Grip Strength 34 kg    Flexibility 17.5 in    Single Leg Stand 3.5 seconds           Nutrition Therapy Plan and Nutrition Goals:  Nutrition Therapy & Goals - 07/09/24 0933       Nutrition Therapy   Diet Heart Healthy Diet    Drug/Food Interactions Statins/Certain Fruits      Personal Nutrition Goals   Nutrition Goal Patient to identify strategies for reducing cardiovascular risk by attending the Pritikin education and nutrition series weekly.   goal in progress.   Personal Goal #2 Patient to improve diet quality by using the plate method as a guide for meal planning to include lean protein/plant protein, fruits, vegetables, whole grains, nonfat dairy as part of a well-balanced diet.   goal in progress.   Personal Goal #3 Patient to identify strategies for weight loss of 0.5-2.0# per week.   goals in progress.   Comments Goals in progress; however, attendance has been variable. Eymi has medical history of CAD s/p PCI, DM2, HTN, hyperlipidemia, aortic atherosclerosis, CKD2. She continues mounjaro for weight loss and blood sugar control. She is down 4# sinces starting with our program. LDL is at goal. Patient will benefit from participation in intensive cardiac rehab for nutrition education, exercise, and lifestyle modification.      Intervention Plan   Intervention Prescribe, educate and counsel regarding individualized specific dietary modifications aiming towards targeted core components such as weight, hypertension, lipid management, diabetes, heart failure and other comorbidities.;Nutrition  handout(s) given to patient.    Expected Outcomes Short Term Goal: Understand basic principles of dietary content, such as calories, fat, sodium, cholesterol and nutrients.;Long Term Goal: Adherence to prescribed nutrition plan.          Nutrition Assessments:  MEDIFICTS Score Key: >=70 Need to make dietary changes  40-70 Heart Healthy Diet <= 40 Therapeutic Level Cholesterol Diet   Flowsheet Row INTENSIVE CARDIAC REHAB from 06/11/2024 in [facility]  Picture Your Plate Total Score on Admission 48   Picture Your Plate Scores: <59 Unhealthy dietary pattern with much room for improvement. 41-50 Dietary pattern unlikely to meet recommendations for good health and room for improvement. 51-60 More healthful dietary pattern, with some room for improvement.  >60 Healthy dietary pattern, although there may be some specific behaviors that could be improved.    Nutrition Goals Re-Evaluation:  Nutrition Goals Re-Evaluation     Row Name 06/11/24 1556 07/09/24 0933           Goals   Current Weight 193 lb 12.6 oz (87.9 kg) 191 lb 12.8 oz (87 kg)      Comment LDL 64, HDL 34 LDL 64, HDL 34      Expected Outcome Bracha has medical history of CAD s/p PCI, DM2, HTN, hyperlipidemia, aortic atherosclerosis, CKD2. She continues mounjaro for weight loss and blood sugar control. LDL is at goal. Patient will benefit from participation in intensive  cardiac rehab for nutrition education, exercise, and lifestyle modification. Goals in progress; however, attendance has been variable. Melinda has medical history of CAD s/p PCI, DM2, HTN, hyperlipidemia, aortic atherosclerosis, CKD2. She continues mounjaro for weight loss and blood sugar control. She is down 4# sinces starting with our program. LDL is at goal. Patient will benefit from participation in intensive cardiac rehab for nutrition education, exercise, and lifestyle modification.          Nutrition Goals Re-Evaluation:  Nutrition Goals Re-Evaluation     Row Name 06/11/24 1556 07/09/24 0933           Goals   Current Weight 193 lb 12.6 oz (87.9 kg) 191 lb 12.8 oz (87 kg)      Comment LDL 64, HDL 34 LDL 64, HDL 34      Expected Outcome Gwenna has medical history of CAD s/p PCI, DM2, HTN, hyperlipidemia, aortic atherosclerosis, CKD2. She continues mounjaro for weight loss and blood sugar control. LDL is at goal. Patient will benefit from participation in intensive cardiac rehab for nutrition education, exercise, and lifestyle modification. Goals in progress; however, attendance has been variable. Journiee has medical history of CAD s/p PCI, DM2, HTN, hyperlipidemia, aortic atherosclerosis, CKD2. She continues mounjaro for weight loss and blood sugar control. She is down 4# sinces starting with our program. LDL is at goal. Patient will benefit from participation in intensive cardiac rehab for nutrition education, exercise, and lifestyle modification.         Nutrition Goals Discharge (Final Nutrition Goals Re-Evaluation):  Nutrition Goals Re-Evaluation - 07/09/24 0933       Goals   Current Weight 191 lb 12.8 oz (87 kg)    Comment LDL 64, HDL 34    Expected Outcome Goals in progress; however, attendance has been variable. Luzelena has medical history of CAD s/p PCI, DM2, HTN, hyperlipidemia, aortic atherosclerosis, CKD2. She continues mounjaro for weight loss and blood sugar control. She is down 4# sinces starting with our program. LDL is at goal. Patient will benefit from participation in intensive cardiac rehab for nutrition education, exercise, and lifestyle modification.          Psychosocial: Target Goals: Acknowledge presence or absence of significant depression and/or stress, maximize coping skills, provide positive support system. Participant is able to verbalize types and ability to use techniques and skills needed for reducing stress and depression.  Initial  Review & Psychosocial Screening:  Initial Psych Review & Screening - 06/03/24 1012       Initial Review   Current issues with Current Depression;Current Sleep Concerns      Family Dynamics   Good Support System? Yes   Pt has daughters, sisters, brothers, extended family     Barriers   Psychosocial barriers to participate in program The patient should benefit from training in stress management and relaxation.      Screening Interventions   Interventions Encouraged to exercise    Expected Outcomes Short Term goal: Utilizing psychosocial counselor, staff and physician to assist with identification of specific Stressors or current issues interfering with healing process. Setting desired goal for each stressor or current issue identified.;Long Term Goal: Stressors or current issues are controlled or eliminated.;Short Term goal: Identification and review with participant of any Quality of Life or Depression concerns found by scoring the questionnaire.;Long Term goal: The participant improves quality of Life and PHQ9 Scores as seen by post scores and/or verbalization of changes          Quality of  Life Scores:  Quality of Life - 06/03/24 1120       Quality of Life   Select Quality of Life      Quality of Life Scores   Health/Function Pre 15 %    Socioeconomic Pre 23.79 %    Psych/Spiritual Pre 19.64 %    Family Pre 12 %    GLOBAL Pre 17.74 %         Scores of 19 and below usually indicate a poorer quality of life in these areas.  A difference of  2-3 points is a clinically meaningful difference.  A difference of 2-3 points in the total score of the Quality of Life Index has been associated with significant improvement in overall quality of life, self-image, physical symptoms, and general health in studies assessing change in quality of life.  PHQ-9: Review Flowsheet       06/03/2024 12/24/2013  Depression screen PHQ 2/9  Decreased Interest 0 0  Down, Depressed, Hopeless 1 0  PHQ  - 2 Score 1 0  Altered sleeping 3 -  Tired, decreased energy 1 -  Change in appetite 0 -  Feeling bad or failure about yourself  2 -  Trouble concentrating 0 -  Moving slowly or fidgety/restless 0 -  Suicidal thoughts 0 -  PHQ-9 Score 7 -  Difficult doing work/chores Not difficult at all -   Interpretation of Total Score  Total Score Depression Severity:  1-4 = Minimal depression, 5-9 = Mild depression, 10-14 = Moderate depression, 15-19 = Moderately severe depression, 20-27 = Severe depression   Psychosocial Evaluation and Intervention:   Psychosocial Re-Evaluation:  Psychosocial Re-Evaluation     Row Name 06/11/24 1710 07/15/24 1824           Psychosocial Re-Evaluation   Current issues with Current Depression;Current Sleep Concerns Current Depression;Current Sleep Concerns      Comments Amiracle started cardiac rehab on 06/11/24. Marceil did not voice any increased concerns or stressors on her first day of exercise Tyreanna is currently out of state with family      Expected Outcomes Jaclynne will have controlled or decreased depression/ stressors upon completion of cardiac rehab Rayah will have controlled or decreased depression/ stressors upon completion of cardiac rehab      Interventions Stress management education;Relaxation education;Encouraged to attend Cardiac Rehabilitation for the exercise Stress management education;Relaxation education;Encouraged to attend Cardiac Rehabilitation for the exercise      Continue Psychosocial Services  Follow up required by staff Follow up required by staff         Psychosocial Discharge (Final Psychosocial Re-Evaluation):  Psychosocial Re-Evaluation - 07/15/24 1824       Psychosocial Re-Evaluation   Current issues with Current Depression;Current Sleep Concerns    Comments Jalasia is currently out of state with family    Expected Outcomes Anitria will have controlled or decreased depression/ stressors upon completion of cardiac rehab     Interventions Stress management education;Relaxation education;Encouraged to attend Cardiac Rehabilitation for the exercise    Continue Psychosocial Services  Follow up required by staff          Vocational Rehabilitation: Provide vocational rehab assistance to qualifying candidates.   Vocational Rehab Evaluation & Intervention:  Vocational Rehab - 06/03/24 1013       Initial Vocational Rehab Evaluation & Intervention   Assessment shows need for Vocational Rehabilitation No   Pt is retired         Education: Education Goals: Education classes will be provided  on a weekly basis, covering required topics. Participant will state understanding/return demonstration of topics presented.    Education     Row Name 06/11/24 0800     Education   Cardiac Education Topics Pritikin   Secondary school teacher School   Educator Dietitian   Weekly Topic Simple Sides and Sauces   Instruction Review Code 1- Verbalizes Understanding   Class Start Time 0815   Class Stop Time 0846   Class Time Calculation (min) 31 min    Row Name 06/16/24 0800     Education   Cardiac Education Topics Pritikin   Select Core Videos     Core Videos   Educator Exercise Physiologist   Select General Education   General Education Hypertension and Heart Disease   Instruction Review Code 1- Verbalizes Understanding   Class Start Time 0815   Class Stop Time 0851   Class Time Calculation (min) 36 min    Row Name 06/18/24 0900     Education   Cardiac Education Topics Pritikin   Secondary school teacher School   Educator Dietitian   Weekly Topic Powerhouse Plant-Based Proteins   Instruction Review Code 1- Verbalizes Understanding   Class Start Time 0815   Class Stop Time 0850   Class Time Calculation (min) 35 min    Row Name 06/30/24 0800     Education   Cardiac Education Topics Pritikin   Select Core Videos     Core Videos   Educator Exercise Physiologist   Select  Psychosocial   Psychosocial Healthy Minds, Bodies, Hearts   Instruction Review Code 1- Verbalizes Understanding   Class Start Time 0815   Class Stop Time 0850   Class Time Calculation (min) 35 min      Core Videos: Exercise    Move It!  Clinical staff conducted group or individual video education with verbal and written material and guidebook.  Patient learns the recommended Pritikin exercise program. Exercise with the goal of living a long, healthy life. Some of the health benefits of exercise include controlled diabetes, healthier blood pressure levels, improved cholesterol levels, improved heart and lung capacity, improved sleep, and better body composition. Everyone should speak with their doctor before starting or changing an exercise routine.  Biomechanical Limitations Clinical staff conducted group or individual video education with verbal and written material and guidebook.  Patient learns how biomechanical limitations can impact exercise and how we can mitigate and possibly overcome limitations to have an impactful and balanced exercise routine.  Body Composition Clinical staff conducted group or individual video education with verbal and written material and guidebook.  Patient learns that body composition (ratio of muscle mass to fat mass) is a key component to assessing overall fitness, rather than body weight alone. Increased fat mass, especially visceral belly fat, can put us  at increased risk for metabolic syndrome, type 2 diabetes, heart disease, and even death. It is recommended to combine diet and exercise (cardiovascular and resistance training) to improve your body composition. Seek guidance from your physician and exercise physiologist before implementing an exercise routine.  Exercise Action Plan Clinical staff conducted group or individual video education with verbal and written material and guidebook.  Patient learns the recommended strategies to achieve and enjoy  long-term exercise adherence, including variety, self-motivation, self-efficacy, and positive decision making. Benefits of exercise include fitness, good health, weight management, more energy, better sleep, less stress, and overall well-being.  Medical   Heart  Disease Risk Reduction Clinical staff conducted group or individual video education with verbal and written material and guidebook.  Patient learns our heart is our most vital organ as it circulates oxygen, nutrients, white blood cells, and hormones throughout the entire body, and carries waste away. Data supports a plant-based eating plan like the Pritikin Program for its effectiveness in slowing progression of and reversing heart disease. The video provides a number of recommendations to address heart disease.   Metabolic Syndrome and Belly Fat  Clinical staff conducted group or individual video education with verbal and written material and guidebook.  Patient learns what metabolic syndrome is, how it leads to heart disease, and how one can reverse it and keep it from coming back. You have metabolic syndrome if you have 3 of the following 5 criteria: abdominal obesity, high blood pressure, high triglycerides, low HDL cholesterol, and high blood sugar.  Hypertension and Heart Disease Clinical staff conducted group or individual video education with verbal and written material and guidebook.  Patient learns that high blood pressure, or hypertension, is very common in the [place] . Hypertension is largely due to excessive salt intake, but other important risk factors include being overweight, physical inactivity, drinking too much alcohol, smoking, and not eating enough potassium from fruits and vegetables. High blood pressure is a leading risk factor for heart attack, stroke, congestive heart failure, dementia, kidney failure, and premature death. Long-term effects of excessive salt intake include stiffening of the arteries and thickening  of heart muscle and organ damage. Recommendations include ways to reduce hypertension and the risk of heart disease.  Diseases of Our Time - Focusing on Diabetes Clinical staff conducted group or individual video education with verbal and written material and guidebook.  Patient learns why the best way to stop diseases of our time is prevention, through food and other lifestyle changes. Medicine (such as prescription pills and surgeries) is often only a Band-Aid on the problem, not a long-term solution. Most common diseases of our time include obesity, type 2 diabetes, hypertension, heart disease, and cancer. The Pritikin Program is recommended and has been proven to help reduce, reverse, and/or prevent the damaging effects of metabolic syndrome.  Nutrition   Overview of the Pritikin Eating Plan  Clinical staff conducted group or individual video education with verbal and written material and guidebook.  Patient learns about the Pritikin Eating Plan for disease risk reduction. The Pritikin Eating Plan emphasizes a wide variety of unrefined, minimally-processed carbohydrates, like fruits, vegetables, whole grains, and legumes. Go, Caution, and Stop food choices are explained. Plant-based and lean animal proteins are emphasized. Rationale provided for low sodium intake for blood pressure control, low added sugars for blood sugar stabilization, and low added fats and oils for coronary artery disease risk reduction and weight management.  Calorie Density  Clinical staff conducted group or individual video education with verbal and written material and guidebook.  Patient learns about calorie density and how it impacts the Pritikin Eating Plan. Knowing the characteristics of the food you choose will help you decide whether those foods will lead to weight gain or weight loss, and whether you want to consume more or less of them. Weight loss is usually a side effect of the Pritikin Eating Plan because of its  focus on low calorie-dense foods.  Label Reading  Clinical staff conducted group or individual video education with verbal and written material and guidebook.  Patient learns about the Pritikin recommended label reading guidelines and corresponding recommendations regarding  calorie density, added sugars, sodium content, and whole grains.  Dining Out - Part 1  Clinical staff conducted group or individual video education with verbal and written material and guidebook.  Patient learns that restaurant meals can be sabotaging because they can be so high in calories, fat, sodium, and/or sugar. Patient learns recommended strategies on how to positively address this and avoid unhealthy pitfalls.  Facts on Fats  Clinical staff conducted group or individual video education with verbal and written material and guidebook.  Patient learns that lifestyle modifications can be just as effective, if not more so, as many medications for lowering your risk of heart disease. A Pritikin lifestyle can help to reduce your risk of inflammation and atherosclerosis (cholesterol build-up, or plaque, in the artery walls). Lifestyle interventions such as dietary choices and physical activity address the cause of atherosclerosis. A review of the types of fats and their impact on blood cholesterol levels, along with dietary recommendations to reduce fat intake is also included.  Nutrition Action Plan  Clinical staff conducted group or individual video education with verbal and written material and guidebook.  Patient learns how to incorporate Pritikin recommendations into their lifestyle. Recommendations include planning and keeping personal health goals in mind as an important part of their success.  Healthy Mind-Set    Healthy Minds, Bodies, Hearts  Clinical staff conducted group or individual video education with verbal and written material and guidebook.  Patient learns how to identify when they are stressed. Video will  discuss the impact of that stress, as well as the many benefits of stress management. Patient will also be introduced to stress management techniques. The way we think, act, and feel has an impact on our hearts.  How Our Thoughts Can Heal Our Hearts  Clinical staff conducted group or individual video education with verbal and written material and guidebook.  Patient learns that negative thoughts can cause depression and anxiety. This can result in negative lifestyle behavior and serious health problems. Cognitive behavioral therapy is an effective method to help control our thoughts in order to change and improve our emotional outlook.  Additional Videos:  Exercise    Improving Performance  Clinical staff conducted group or individual video education with verbal and written material and guidebook.  Patient learns to use a non-linear approach by alternating intensity levels and lengths of time spent exercising to help burn more calories and lose more body fat. Cardiovascular exercise helps improve heart health, metabolism, hormonal balance, blood sugar control, and recovery from fatigue. Resistance training improves strength, endurance, balance, coordination, reaction time, metabolism, and muscle mass. Flexibility exercise improves circulation, posture, and balance. Seek guidance from your physician and exercise physiologist before implementing an exercise routine and learn your capabilities and proper form for all exercise.  Introduction to Yoga  Clinical staff conducted group or individual video education with verbal and written material and guidebook.  Patient learns about yoga, a discipline of the coming together of mind, breath, and body. The benefits of yoga include improved flexibility, improved range of motion, better posture and core strength, increased lung function, weight loss, and positive self-image. Yoga's heart health benefits include lowered blood pressure, healthier heart rate,  decreased cholesterol and triglyceride levels, improved immune function, and reduced stress. Seek guidance from your physician and exercise physiologist before implementing an exercise routine and learn your capabilities and proper form for all exercise.  Medical   Aging: Enhancing Your Quality of Life  Clinical staff conducted group or individual video education  with verbal and written material and guidebook.  Patient learns key strategies and recommendations to stay in good physical health and enhance quality of life, such as prevention strategies, having an advocate, securing a Health Care Proxy and Power of Attorney, and keeping a list of medications and system for tracking them. It also discusses how to avoid risk for bone loss.  Biology of Weight Control  Clinical staff conducted group or individual video education with verbal and written material and guidebook.  Patient learns that weight gain occurs because we consume more calories than we burn (eating more, moving less). Even if your body weight is normal, you may have higher ratios of fat compared to muscle mass. Too much body fat puts you at increased risk for cardiovascular disease, heart attack, stroke, type 2 diabetes, and obesity-related cancers. In addition to exercise, following the Pritikin Eating Plan can help reduce your risk.  Decoding Lab Results  Clinical staff conducted group or individual video education with verbal and written material and guidebook.  Patient learns that lab test reflects one measurement whose values change over time and are influenced by many factors, including medication, stress, sleep, exercise, food, hydration, pre-existing medical conditions, and more. It is recommended to use the knowledge from this video to become more involved with your lab results and evaluate your numbers to speak with your doctor.   Diseases of Our Time - Overview  Clinical staff conducted group or individual video education with  verbal and written material and guidebook.  Patient learns that according to the CDC, 50% to 70% of chronic diseases (such as obesity, type 2 diabetes, elevated lipids, hypertension, and heart disease) are avoidable through lifestyle improvements including healthier food choices, listening to satiety cues, and increased physical activity.  Sleep Disorders Clinical staff conducted group or individual video education with verbal and written material and guidebook.  Patient learns how good quality and duration of sleep are important to overall health and well-being. Patient also learns about sleep disorders and how they impact health along with recommendations to address them, including discussing with a physician.  Nutrition  Dining Out - Part 2 Clinical staff conducted group or individual video education with verbal and written material and guidebook.  Patient learns how to plan ahead and communicate in order to maximize their dining experience in a healthy and nutritious manner. Included are recommended food choices based on the type of restaurant the patient is visiting.   Fueling a Banker conducted group or individual video education with verbal and written material and guidebook.  There is a strong connection between our food choices and our health. Diseases like obesity and type 2 diabetes are very prevalent and are in large-part due to lifestyle choices. The Pritikin Eating Plan provides plenty of food and hunger-curbing satisfaction. It is easy to follow, affordable, and helps reduce health risks.  Menu Workshop  Clinical staff conducted group or individual video education with verbal and written material and guidebook.  Patient learns that restaurant meals can sabotage health goals because they are often packed with calories, fat, sodium, and sugar. Recommendations include strategies to plan ahead and to communicate with the manager, chef, or server to help order a  healthier meal.  Planning Your Eating Strategy  Clinical staff conducted group or individual video education with verbal and written material and guidebook.  Patient learns about the Pritikin Eating Plan and its benefit of reducing the risk of disease. The Pritikin Eating Plan does not  focus on calories. Instead, it emphasizes high-quality, nutrient-rich foods. By knowing the characteristics of the foods, we choose, we can determine their calorie density and make informed decisions.  Targeting Your Nutrition Priorities  Clinical staff conducted group or individual video education with verbal and written material and guidebook.  Patient learns that lifestyle habits have a tremendous impact on disease risk and progression. This video provides eating and physical activity recommendations based on your personal health goals, such as reducing LDL cholesterol, losing weight, preventing or controlling type 2 diabetes, and reducing high blood pressure.  Vitamins and Minerals  Clinical staff conducted group or individual video education with verbal and written material and guidebook.  Patient learns different ways to obtain key vitamins and minerals, including through a recommended healthy diet. It is important to discuss all supplements you take with your doctor.   Healthy Mind-Set    Smoking Cessation  Clinical staff conducted group or individual video education with verbal and written material and guidebook.  Patient learns that cigarette smoking and tobacco addiction pose a serious health risk which affects millions of people. Stopping smoking will significantly reduce the risk of heart disease, lung disease, and many forms of cancer. Recommended strategies for quitting are covered, including working with your doctor to develop a successful plan.  Culinary   Becoming a Set designer conducted group or individual video education with verbal and written material and guidebook.   Patient learns that cooking at home can be healthy, cost-effective, quick, and puts them in control. Keys to cooking healthy recipes will include looking at your recipe, assessing your equipment needs, planning ahead, making it simple, choosing cost-effective seasonal ingredients, and limiting the use of added fats, salts, and sugars.  Cooking - Breakfast and Snacks  Clinical staff conducted group or individual video education with verbal and written material and guidebook.  Patient learns how important breakfast is to satiety and nutrition through the entire day. Recommendations include key foods to eat during breakfast to help stabilize blood sugar levels and to prevent overeating at meals later in the day. Planning ahead is also a key component.  Cooking - Educational psychologist conducted group or individual video education with verbal and written material and guidebook.  Patient learns eating strategies to improve overall health, including an approach to cook more at home. Recommendations include thinking of animal protein as a side on your plate rather than center stage and focusing instead on lower calorie dense options like vegetables, fruits, whole grains, and plant-based proteins, such as beans. Making sauces in large quantities to freeze for later and leaving the skin on your vegetables are also recommended to maximize your experience.  Cooking - Healthy Salads and Dressing Clinical staff conducted group or individual video education with verbal and written material and guidebook.  Patient learns that vegetables, fruits, whole grains, and legumes are the foundations of the Pritikin Eating Plan. Recommendations include how to incorporate each of these in flavorful and healthy salads, and how to create homemade salad dressings. Proper handling of ingredients is also covered. Cooking - Soups and [employer] - Soups and Desserts Clinical staff conducted group or individual video  education with verbal and written material and guidebook.  Patient learns that Pritikin soups and desserts make for easy, nutritious, and delicious snacks and meal components that are low in sodium, fat, sugar, and calorie density, while high in vitamins, minerals, and filling fiber. Recommendations include simple and healthy ideas for  soups and desserts.   Overview     The Pritikin Solution Program Overview Clinical staff conducted group or individual video education with verbal and written material and guidebook.  Patient learns that the results of the Pritikin Program have been documented in more than 100 articles published in peer-reviewed journals, and the benefits include reducing risk factors for (and, in some cases, even reversing) high cholesterol, high blood pressure, type 2 diabetes, obesity, and more! An overview of the three key pillars of the Pritikin Program will be covered: eating well, doing regular exercise, and having a healthy mind-set.  WORKSHOPS  Exercise: Exercise Basics: Building Your Action Plan Clinical staff led group instruction and group discussion with PowerPoint presentation and patient guidebook. To enhance the learning environment the use of posters, models and videos may be added. At the conclusion of this workshop, patients will comprehend the difference between physical activity and exercise, as well as the benefits of incorporating both, into their routine. Patients will understand the FITT (Frequency, Intensity, Time, and Type) principle and how to use it to build an exercise action plan. In addition, safety concerns and other considerations for exercise and cardiac rehab will be addressed by the presenter. The purpose of this lesson is to promote a comprehensive and effective weekly exercise routine in order to improve patients' overall level of fitness.   Managing Heart Disease: Your Path to a Healthier Heart Clinical staff led group instruction and group  discussion with PowerPoint presentation and patient guidebook. To enhance the learning environment the use of posters, models and videos may be added.At the conclusion of this workshop, patients will understand the anatomy and physiology of the heart. Additionally, they will understand how Pritikin's three pillars impact the risk factors, the progression, and the management of heart disease.  The purpose of this lesson is to provide a high-level overview of the heart, heart disease, and how the Pritikin lifestyle positively impacts risk factors.  Exercise Biomechanics Clinical staff led group instruction and group discussion with PowerPoint presentation and patient guidebook. To enhance the learning environment the use of posters, models and videos may be added. Patients will learn how the structural parts of their bodies function and how these functions impact their daily activities, movement, and exercise. Patients will learn how to promote a neutral spine, learn how to manage pain, and identify ways to improve their physical movement in order to promote healthy living. The purpose of this lesson is to expose patients to common physical limitations that impact physical activity. Participants will learn practical ways to adapt and manage aches and pains, and to minimize their effect on regular exercise. Patients will learn how to maintain good posture while sitting, walking, and lifting.  Balance Training and Fall Prevention  Clinical staff led group instruction and group discussion with PowerPoint presentation and patient guidebook. To enhance the learning environment the use of posters, models and videos may be added. At the conclusion of this workshop, patients will understand the importance of their sensorimotor skills (vision, proprioception, and the vestibular system) in maintaining their ability to balance as they age. Patients will apply a variety of balancing exercises that are  appropriate for their current level of function. Patients will understand the common causes for poor balance, possible solutions to these problems, and ways to modify their physical environment in order to minimize their fall risk. The purpose of this lesson is to teach patients about the importance of maintaining balance as they age and ways to minimize their  risk of falling.  WORKSHOPS   Nutrition:  Fueling a Ship broker led group instruction and group discussion with PowerPoint presentation and patient guidebook. To enhance the learning environment the use of posters, models and videos may be added. Patients will review the foundational principles of the Pritikin Eating Plan and understand what constitutes a serving size in each of the food groups. Patients will also learn Pritikin-friendly foods that are better choices when away from home and review make-ahead meal and snack options. Calorie density will be reviewed and applied to three nutrition priorities: weight maintenance, weight loss, and weight gain. The purpose of this lesson is to reinforce (in a group setting) the key concepts around what patients are recommended to eat and how to apply these guidelines when away from home by planning and selecting Pritikin-friendly options. Patients will understand how calorie density may be adjusted for different weight management goals.  Mindful Eating  Clinical staff led group instruction and group discussion with PowerPoint presentation and patient guidebook. To enhance the learning environment the use of posters, models and videos may be added. Patients will briefly review the concepts of the Pritikin Eating Plan and the importance of low-calorie dense foods. The concept of mindful eating will be introduced as well as the importance of paying attention to internal hunger signals. Triggers for non-hunger eating and techniques for dealing with triggers will be explored. The purpose of  this lesson is to provide patients with the opportunity to review the basic principles of the Pritikin Eating Plan, discuss the value of eating mindfully and how to measure internal cues of hunger and fullness using the Hunger Scale. Patients will also discuss reasons for non-hunger eating and learn strategies to use for controlling emotional eating.  Targeting Your Nutrition Priorities Clinical staff led group instruction and group discussion with PowerPoint presentation and patient guidebook. To enhance the learning environment the use of posters, models and videos may be added. Patients will learn how to determine their genetic susceptibility to disease by reviewing their family history. Patients will gain insight into the importance of diet as part of an overall healthy lifestyle in mitigating the impact of genetics and other environmental insults. The purpose of this lesson is to provide patients with the opportunity to assess their personal nutrition priorities by looking at their family history, their own health history and current risk factors. Patients will also be able to discuss ways of prioritizing and modifying the Pritikin Eating Plan for their highest risk areas  Menu  Clinical staff led group instruction and group discussion with PowerPoint presentation and patient guidebook. To enhance the learning environment the use of posters, models and videos may be added. Using menus brought in from [employer], or printed from [employer], patients will apply the Pritikin dining out guidelines that were presented in the [employer] video. Patients will also be able to practice these guidelines in a variety of provided scenarios. The purpose of this lesson is to provide patients with the opportunity to practice hands-on learning of the Pritikin Dining Out guidelines with actual menus and practice scenarios.  Label Reading Clinical staff led group instruction and group  discussion with PowerPoint presentation and patient guidebook. To enhance the learning environment the use of posters, models and videos may be added. Patients will review and discuss the Pritikin label reading guidelines presented in [employer]ritikin's Label Reading Educational series video. Using fool labels brought in from local grocery stores and markets, patients will apply  the label reading guidelines and determine if the packaged food meet the Pritikin guidelines. The purpose of this lesson is to provide patients with the opportunity to review, discuss, and practice hands-on learning of the Pritikin Label Reading guidelines with actual packaged food labels. Cooking School  [employer]ik[employer]'s [employer] are designed to teach patients ways to prepare quick, simple, and affordable recipes at home. The importance of nutrition's role in chronic disease risk reduction is reflected in its emphasis in the overall Pritikin program. By learning how to prepare essential core Pritikin Eating Plan recipes, patients will increase control over what they eat; be able to customize the flavor of foods without the use of added salt, sugar, or fat; and improve the quality of the food they consume. By learning a set of core recipes which are easily assembled, quickly prepared, and affordable, patients are more likely to prepare more healthy foods at home. These workshops focus on convenient breakfasts, simple entres, side dishes, and desserts which can be prepared with minimal effort and are consistent with nutrition recommendations for cardiovascular risk reduction. Cooking [employer] are taught by a Armed forces logistics/support/administrative officer (RD) who has been trained by the [employer]. The chef or RD has a clear understanding of the importance of minimizing - if not completely eliminating - added fat, sugar, and sodium in recipes. Throughout the series of Cooking School Workshop sessions, patients will learn about  healthy ingredients and efficient methods of cooking to build confidence in their capability to prepare    Cooking School weekly topics:  Adding Flavor- Sodium-Free  Fast and Healthy Breakfasts  Powerhouse Plant-Based Proteins  Satisfying Salads and Dressings  Simple Sides and Sauces  International Cuisine-Spotlight on the [employer] Zones  Delicious Desserts  Savory Soups  [employer] - Meals in a Astronomer Appetizers and Snacks  Comforting Weekend Breakfasts  One-Pot Wonders   Fast Evening Meals  Landscape architect Your Pritikin Plate  WORKSHOPS   Healthy Mindset (Psychosocial):  Focused Goals, Sustainable Changes Clinical staff led group instruction and group discussion with PowerPoint presentation and patient guidebook. To enhance the learning environment the use of posters, models and videos may be added. Patients will be able to apply effective goal setting strategies to establish at least one personal goal, and then take consistent, meaningful action toward that goal. They will learn to identify common barriers to achieving personal goals and develop strategies to overcome them. Patients will also gain an understanding of how our mind-set can impact our ability to achieve goals and the importance of cultivating a positive and growth-oriented mind-set. The purpose of this lesson is to provide patients with a deeper understanding of how to set and achieve personal goals, as well as the tools and strategies needed to overcome common obstacles which may arise along the way.  From Head to Heart: The Power of a Healthy Outlook  Clinical staff led group instruction and group discussion with PowerPoint presentation and patient guidebook. To enhance the learning environment the use of posters, models and videos may be added. Patients will be able to recognize and describe the impact of emotions and mood on physical health. They will discover the importance of self-care and  explore self-care practices which may work for them. Patients will also learn how to utilize the 4 C's to cultivate a healthier outlook and better manage stress and challenges. The purpose of this lesson is to demonstrate to patients how a healthy outlook is an  essential part of maintaining good health, especially as they continue their cardiac rehab journey.  Healthy Sleep for a Healthy Heart Clinical staff led group instruction and group discussion with PowerPoint presentation and patient guidebook. To enhance the learning environment the use of posters, models and videos may be added. At the conclusion of this workshop, patients will be able to demonstrate knowledge of the importance of sleep to overall health, well-being, and quality of life. They will understand the symptoms of, and treatments for, common sleep disorders. Patients will also be able to identify daytime and nighttime behaviors which impact sleep, and they will be able to apply these tools to help manage sleep-related challenges. The purpose of this lesson is to provide patients with a general overview of sleep and outline the importance of quality sleep. Patients will learn about a few of the most common sleep disorders. Patients will also be introduced to the concept of "sleep hygiene," and discover ways to self-manage certain sleeping problems through simple daily behavior changes. Finally, the workshop will motivate patients by clarifying the links between quality sleep and their goals of heart-healthy living.   Recognizing and Reducing Stress Clinical staff led group instruction and group discussion with PowerPoint presentation and patient guidebook. To enhance the learning environment the use of posters, models and videos may be added. At the conclusion of this workshop, patients will be able to understand the types of stress reactions, differentiate between acute and chronic stress, and recognize the impact that chronic stress has on  their health. They will also be able to apply different coping mechanisms, such as reframing negative self-talk. Patients will have the opportunity to practice a variety of stress management techniques, such as deep abdominal breathing, progressive muscle relaxation, and/or guided imagery.  The purpose of this lesson is to educate patients on the role of stress in their lives and to provide healthy techniques for coping with it.  Learning Barriers/Preferences:  Learning Barriers/Preferences - 06/03/24 1131       Learning Barriers/Preferences   Learning Barriers Sight    Learning Preferences Audio;Computer/Internet;Group Instruction;Individual Instruction;Pictoral;Skilled Demonstration;Verbal Instruction;Video;Written Material          Education Topics:  Knowledge Questionnaire Score:  Knowledge Questionnaire Score - 06/03/24 1132       Knowledge Questionnaire Score   Pre Score 23/24          Core Components/Risk Factors/Patient Goals at Admission:  Personal Goals and Risk Factors at Admission - 06/03/24 1135       Core Components/Risk Factors/Patient Goals on Admission    Weight Management Obesity;Weight Loss    Diabetes Yes    Intervention Provide education about signs/symptoms and action to take for hypo/hyperglycemia.;Provide education about proper nutrition, including hydration, and aerobic/resistive exercise prescription along with prescribed medications to achieve blood glucose in normal ranges: Fasting glucose 65-99 mg/dL    Expected Outcomes Short Term: Participant verbalizes understanding of the signs/symptoms and immediate care of hyper/hypoglycemia, proper foot care and importance of medication, aerobic/resistive exercise and nutrition plan for blood glucose control.;Long Term: Attainment of HbA1C < 7%.    Hypertension Yes    Intervention Monitor prescription use compliance.;Provide education on lifestyle modifcations including regular physical activity/exercise,  weight management, moderate sodium restriction and increased consumption of fresh fruit, vegetables, and low fat dairy, alcohol moderation, and smoking cessation.    Expected Outcomes Short Term: Continued assessment and intervention until BP is < 140/79mm HG in hypertensive participants. < 130/65mm HG in hypertensive participants with diabetes, heart failure  or chronic kidney disease.;Long Term: Maintenance of blood pressure at goal levels.    Lipids Yes    Intervention Provide education and support for participant on nutrition & aerobic/resistive exercise along with prescribed medications to achieve LDL 70mg , HDL >40mg .    Expected Outcomes Short Term: Participant states understanding of desired cholesterol values and is compliant with medications prescribed. Participant is following exercise prescription and nutrition guidelines.;Long Term: Cholesterol controlled with medications as prescribed, with individualized exercise RX and with personalized nutrition plan. Value goals: LDL < 70mg , HDL > 40 mg.    Stress Yes    Intervention Offer individual and/or small group education and counseling on adjustment to heart disease, stress management and health-related lifestyle change. Teach and support self-help strategies.;Refer participants experiencing significant psychosocial distress to appropriate mental health specialists for further evaluation and treatment. When possible, include family members and significant others in education/counseling sessions.    Expected Outcomes Short Term: Participant demonstrates changes in health-related behavior, relaxation and other stress management skills, ability to obtain effective social support, and compliance with psychotropic medications if prescribed.;Long Term: Emotional wellbeing is indicated by absence of clinically significant psychosocial distress or social isolation.          Core Components/Risk Factors/Patient Goals Review:   Goals and Risk Factor  Review     Row Name 06/11/24 1714 07/15/24 1824           Core Components/Risk Factors/Patient Goals Review   Personal Goals Review Weight Management/Obesity;Stress;Hypertension;Diabetes;Lipids Weight Management/Obesity;Stress;Hypertension;Diabetes;Lipids      Review Sherrilynn started cardiac rehab on 06/11/24. Fartun did well with exercise. Vital signs were stable. Tayler is currently out of town with her daughter      Expected Outcomes Nyaja will continue to participate in cardiac rehab for exercise, nutrition and lifestyle modifications. Lakeitha will continue to participate in cardiac rehab for exercise, nutrition and lifestyle modifications when she returns to exercise.         Core Components/Risk Factors/Patient Goals at Discharge (Final Review):   Goals and Risk Factor Review - 07/15/24 1824       Core Components/Risk Factors/Patient Goals Review   Personal Goals Review Weight Management/Obesity;Stress;Hypertension;Diabetes;Lipids    Review Shailynn is currently out of town with her daughter    Expected Outcomes Sharonna will continue to participate in cardiac rehab for exercise, nutrition and lifestyle modifications when she returns to exercise.          ITP Comments:  ITP Comments     Row Name 06/03/24 1007 06/11/24 1708 07/15/24 1823       ITP Comments Wilbert Holland, MD:  Medical Director.  Intorduction to the [employer] / Intensive Cardiac Rehab.  Initial orientation packet reviewed with the patinet. 30 Day ITP Review. Alina started cardiac rehab on 06/11/24. Emma-Lee did well with exercise. 30 Day ITP Review. Kieu is currently out of state with her daughter        Comments: See ITP comments.Hadassah Quan, RN,BSN 07/15/2024 6:26 PM

## 2024-07-16 ENCOUNTER — Encounter (HOSPITAL_COMMUNITY)

## 2024-07-21 ENCOUNTER — Encounter (HOSPITAL_COMMUNITY): Admission: RE | Admit: 2024-07-21 | Source: Ambulatory Visit

## 2024-07-23 ENCOUNTER — Encounter (HOSPITAL_COMMUNITY)

## 2024-07-25 DIAGNOSIS — H4312 Vitreous hemorrhage, left eye: Secondary | ICD-10-CM | POA: Diagnosis not present

## 2024-07-25 DIAGNOSIS — H18613 Keratoconus, stable, bilateral: Secondary | ICD-10-CM | POA: Diagnosis not present

## 2024-07-25 DIAGNOSIS — H35411 Lattice degeneration of retina, right eye: Secondary | ICD-10-CM | POA: Diagnosis not present

## 2024-07-25 DIAGNOSIS — E113532 Type 2 diabetes mellitus with proliferative diabetic retinopathy with traction retinal detachment not involving the macula, left eye: Secondary | ICD-10-CM | POA: Diagnosis not present

## 2024-07-25 DIAGNOSIS — E113591 Type 2 diabetes mellitus with proliferative diabetic retinopathy without macular edema, right eye: Secondary | ICD-10-CM | POA: Diagnosis not present

## 2024-07-25 DIAGNOSIS — H16223 Keratoconjunctivitis sicca, not specified as Sjogren's, bilateral: Secondary | ICD-10-CM | POA: Diagnosis not present

## 2024-07-25 DIAGNOSIS — H43812 Vitreous degeneration, left eye: Secondary | ICD-10-CM | POA: Diagnosis not present

## 2024-07-25 DIAGNOSIS — Z961 Presence of intraocular lens: Secondary | ICD-10-CM | POA: Diagnosis not present

## 2024-07-28 ENCOUNTER — Encounter (HOSPITAL_COMMUNITY): Admission: RE | Admit: 2024-07-28 | Source: Ambulatory Visit

## 2024-07-29 ENCOUNTER — Telehealth: Payer: Self-pay

## 2024-07-29 ENCOUNTER — Telehealth (HOSPITAL_COMMUNITY): Payer: Self-pay | Admitting: *Deleted

## 2024-07-29 NOTE — Telephone Encounter (Signed)
 Spoke with Kathleen Reid she is having eye surgery on Thursday. Will discharge from cardiac rehab at this time. [name] says she would like to return possibly later on this year.Hadassah Elpidio Quan RN BSN

## 2024-07-29 NOTE — Telephone Encounter (Signed)
   Pre-operative Risk Assessment    Patient Name: Kathleen Reid  DOB: [birth year] MRN: [id number]   Date of last office visit: 05/09/24 Date of next office visit: 11/13/24   Request for Surgical Clearance    Procedure:  Vitrectomy membrane peel, endolaser for retinal detachment   Date of Surgery:  Clearance 07/31/24                                 Surgeon:  Dr. Camellia Baker  Surgeon's Group or Practice Name:  [facility], [place]  Phone number:  [phone] Fax number:  [phone]   Type of Clearance Requested:   - Medical  - Pharmacy:  Hold Aspirin and Clopidogrel  (Plavix ) Not indicated    Type of Anesthesia:  Not Indicated   Additional requests/questions:    [name]   07/29/2024, 11:47 AM

## 2024-07-29 NOTE — Telephone Encounter (Signed)
   Primary Cardiologist: Arun K Thukkani, MD  Chart reviewed as part of pre-operative protocol coverage. Given past medical history and time since last visit, based on ACC/AHA guidelines, Kathleen Reid would be at acceptable risk for the planned procedure without further cardiovascular testing.   Patient should contact our office if she is having new symptoms that are concerning from a cardiac perspective to arrange a follow-up appointment.    Spoke with surgery scheduler. Surgery is urgent given retinal detachment. Verified that surgeon is agreeable for patient to continue aspirin during the perioperative process.   Per office protocol, she may hold Plavix  for 5 days prior to procedure and should resume as soon as hemodynamically stable postoperatively.  I will route this recommendation to the requesting party via Epic fax function and remove from pre-op pool.  Please call with questions.  Kathleen EMERSON Bane, NP-C  07/29/2024, 2:30 PM [address] [place], [place] [address] Office [phone] Fax [phone]

## 2024-07-30 ENCOUNTER — Encounter (HOSPITAL_COMMUNITY): Payer: Self-pay | Admitting: *Deleted

## 2024-07-30 ENCOUNTER — Encounter (HOSPITAL_COMMUNITY): Admission: RE | Admit: 2024-07-30 | Source: Ambulatory Visit

## 2024-07-30 DIAGNOSIS — Z955 Presence of coronary angioplasty implant and graft: Secondary | ICD-10-CM

## 2024-07-30 NOTE — Progress Notes (Signed)
 Kathleen Reid attended 10 exercise and education classes between 06/03/24-06/30/24. Kathleen Reid did well with exercise while in attendance. Kathleen Reid has been out of state caring for her daughter while her son in law was out of town, serves in the [employer]. Kathleen Reid has to have eye surgery this week and will have surgery on the other eye in September. Kathleen Reid has been discharged as requested. Kathleen Reid said she enjoyed participating in cardiac rehab while enrolled in the program.Brittan Mapel Elpidio Quan RN BSN

## 2024-07-31 DIAGNOSIS — H33022 Retinal detachment with multiple breaks, left eye: Secondary | ICD-10-CM | POA: Diagnosis not present

## 2024-07-31 DIAGNOSIS — H4312 Vitreous hemorrhage, left eye: Secondary | ICD-10-CM | POA: Diagnosis not present

## 2024-07-31 DIAGNOSIS — E113592 Type 2 diabetes mellitus with proliferative diabetic retinopathy without macular edema, left eye: Secondary | ICD-10-CM | POA: Diagnosis not present

## 2024-08-06 ENCOUNTER — Encounter (HOSPITAL_COMMUNITY)

## 2024-08-08 DIAGNOSIS — Z9889 Other specified postprocedural states: Secondary | ICD-10-CM | POA: Diagnosis not present

## 2024-08-08 DIAGNOSIS — H33022 Retinal detachment with multiple breaks, left eye: Secondary | ICD-10-CM | POA: Diagnosis not present

## 2024-08-08 DIAGNOSIS — E113592 Type 2 diabetes mellitus with proliferative diabetic retinopathy without macular edema, left eye: Secondary | ICD-10-CM | POA: Diagnosis not present

## 2024-08-08 DIAGNOSIS — H31092 Other chorioretinal scars, left eye: Secondary | ICD-10-CM | POA: Diagnosis not present

## 2024-08-11 ENCOUNTER — Encounter (HOSPITAL_COMMUNITY)

## 2024-08-13 ENCOUNTER — Encounter (HOSPITAL_COMMUNITY)

## 2024-08-18 ENCOUNTER — Encounter (HOSPITAL_COMMUNITY)

## 2024-08-20 ENCOUNTER — Encounter (HOSPITAL_COMMUNITY)

## 2024-08-21 ENCOUNTER — Telehealth: Payer: Self-pay | Admitting: Internal Medicine

## 2024-08-21 DIAGNOSIS — E785 Hyperlipidemia, unspecified: Secondary | ICD-10-CM

## 2024-08-21 NOTE — Telephone Encounter (Signed)
 Pt c/o medication issue:  1. Name of Medication: losartan  (COZAAR ) 25 MG tablet  rosuvastatin  (CRESTOR ) 20 MG tablet  isosorbide  mononitrate (IMDUR ) 30 MG 24 hr tablet   2. How are you currently taking this medication (dosage and times per day)? As written   3. Are you having a reaction (difficulty breathing--STAT)? No  4. What is your medication issue? These two medications are causing her stomach pains.  isosorbide  mononitrate (IMDUR ) 30 MG 24 hr tablet this medication is causing her to feel out of it.

## 2024-08-21 NOTE — Telephone Encounter (Signed)
 Patient states that Imdur  is causing her to feel unsteady and jittery. She states that she is unable to take it when she has plans to be out of the house. She also reports that rosuvastatin  and losartan  cause her to have an upset stomach. She states that she has some nausea and diarrhea when she takes these medications.  She reports otherwise she has been doing good and her blood pressure has been normal. She does not have readings in front of her but states  systolic is usually in the 120s. Advised patient I will check with Dr. Wendel and PharmD staff to see what they recommend.

## 2024-08-21 NOTE — Telephone Encounter (Signed)
 Patient reports since starting Imdur  in March, she has nearly daily episodes of stomach cramps which last about 1-1/2 hours. No diarrhea but is uncomfortable enough that she has to sit and wait for symptoms to disappear. She has tried taking at bedtime and even dividing dose to no avail .She takes medications on full stomach.  Any suggestions?

## 2024-08-21 NOTE — Telephone Encounter (Signed)
 Spoke with the patient and provided reccommendatsion from Dr. Wendel: patient will stop losartan  and crestor . She will start taking imdur  at night. We will await advisement from PharmD in regards to cholesterol medications.

## 2024-08-25 ENCOUNTER — Encounter (HOSPITAL_COMMUNITY)

## 2024-08-26 NOTE — Telephone Encounter (Signed)
 Spoke with pt and let her know the plan of placing a PharmD referral. Pt agreed with plan and thanked us  for calling her and helping her. Order for PharmD referral has been placed and pt has been made aware that someone from our office will be calling her to schedule this appt.

## 2024-08-27 ENCOUNTER — Encounter (HOSPITAL_COMMUNITY)

## 2024-08-29 DIAGNOSIS — M25512 Pain in left shoulder: Secondary | ICD-10-CM | POA: Diagnosis not present

## 2024-09-05 DIAGNOSIS — H31092 Other chorioretinal scars, left eye: Secondary | ICD-10-CM | POA: Diagnosis not present

## 2024-09-05 DIAGNOSIS — Z9889 Other specified postprocedural states: Secondary | ICD-10-CM | POA: Diagnosis not present

## 2024-09-05 DIAGNOSIS — H33022 Retinal detachment with multiple breaks, left eye: Secondary | ICD-10-CM | POA: Diagnosis not present

## 2024-09-05 DIAGNOSIS — E113592 Type 2 diabetes mellitus with proliferative diabetic retinopathy without macular edema, left eye: Secondary | ICD-10-CM | POA: Diagnosis not present

## 2024-09-12 DIAGNOSIS — E119 Type 2 diabetes mellitus without complications: Secondary | ICD-10-CM | POA: Diagnosis not present

## 2024-09-12 DIAGNOSIS — I10 Essential (primary) hypertension: Secondary | ICD-10-CM | POA: Diagnosis not present

## 2024-09-22 ENCOUNTER — Telehealth: Payer: Self-pay

## 2024-09-22 ENCOUNTER — Other Ambulatory Visit (HOSPITAL_COMMUNITY): Payer: Self-pay

## 2024-09-22 ENCOUNTER — Telehealth: Payer: Self-pay | Admitting: Pharmacy Technician

## 2024-09-22 ENCOUNTER — Ambulatory Visit: Attending: Cardiovascular Disease

## 2024-09-22 DIAGNOSIS — E7800 Pure hypercholesterolemia, unspecified: Secondary | ICD-10-CM

## 2024-09-22 MED ORDER — PRALUENT 75 MG/ML ~~LOC~~ SOAJ: 75.0000 mg | SUBCUTANEOUS | 2 refills | Status: AC

## 2024-09-22 NOTE — Telephone Encounter (Signed)
 Pharmacy Patient Advocate Encounter  Received notification from [employer] that Prior Authorization for praluent has been APPROVED from 12/05/23 to 12/03/25. Ran test claim, Copay is $0.00. This test claim was processed through [facility]- copay amounts may vary at other pharmacies due to pharmacy/plan contracts, or as the patient moves through the different stages of their insurance plan.   PA #/Case ID/Reference #: [id number]

## 2024-09-22 NOTE — Addendum Note (Signed)
 Addended by: Donta Fuster E on: 09/22/2024 02:44 PM   Modules accepted: Orders

## 2024-09-22 NOTE — Telephone Encounter (Signed)
   Pharmacy Patient Advocate Encounter   Received notification from Pt Calls Messages that prior authorization for praluent is required/requested.   Insurance verification completed.   The patient is insured through [employer].   Per test claim: PA required; PA submitted to above mentioned insurance via Latent Key/confirmation #/EOC [id number] Status is pending

## 2024-09-22 NOTE — Progress Notes (Signed)
 Patient ID: JASMAINE ROCHEL                 DOB: 1956/07/10                    MRN: [id number]      HPI: Kathleen Reid is a 68 y.o. female patient referred to lipid clinic by Dr. Wendel. PMH is significant for HLD, T2D, CAD (Status post PCI of mid LAD), HTN, and CKD stage 2.  Patient presents today to PharmD lipid clinic. Patient was seen by Dr. Wendel in 05/2024 where she started trial of rosuvastain 20 mg. Patient experienced upset stomach with rosuvastatin  and medication was stopped by cardiology provider ~ a month ago. She states that she has been feeling much better since stopping this medication. Of note, she is also intolerant to atorvastatin (sluggish) which she tried prior to rosuvastatin . Most recent lipid panel on 02/2024 reveals LDL of 64, which is slightly above goal of < 55. However, she believes this is while she was on atorvastatin 80 mg.   Reviewed options for lowering LDL cholesterol, including ezetimibe, PCSK-9 inhibitors, bempedoic acid and inclisiran.  Discussed mechanisms of action, dosing, side effects and potential decreases in LDL cholesterol.  Also reviewed cost information and potential options for patient assistance. Of note, patient has a latex allergy .  Current Medications: none  Intolerances: rosuvastatin  (upset stomach), atorvastatin (sluggish) Risk Factors: 2D, CAD (Status post PCI of mid LAD), HTN, CKD stage 2, family hx of heart disease LDL goal: < 55 Lipid panel (02/2024): Chol 116, Trig 94, HDL 34, LDL 64 Lpa (02/2024): patient to obtain lab today   Diet: Patient states that pasta is her guilty pleasure and she is currently working on reducing her intake Breakfast: skips breakfast  Lunch/Dinner: salads, soups, pasta, cheese Snacks: chips Drinks: water; she would like to drink more water   Family History:  Relation Problem Comments  Mother (Deceased) Diabetes   Heart disease     Father (Deceased) Diabetes   Heart disease     Sister Diabetes    Heart disease       Social History:  Alcohol: none Smoking:none  Labs:  Lipid Panel     Component Value Date/Time   CHOL 198 10/16/2016 1216   TRIG 143.0 10/16/2016 1216   HDL 39.60 10/16/2016 1216   CHOLHDL 5 10/16/2016 1216   VLDL 28.6 10/16/2016 1216   LDLCALC 129 (H) 10/16/2016 1216    Past Medical History:  Diagnosis Date   Diabetes mellitus without complication (HCC)    Eye disease    HLD (hyperlipidemia)     Current Outpatient Medications on File Prior to Visit  Medication Sig Dispense Refill   ACCU-CHEK SMARTVIEW test strip TEST three times a day 100 each 2   aspirin 81 MG chewable tablet Chew 81 mg by mouth daily.     clobetasol (TEMOVATE) 0.05 % external solution APP 1 ML EXT AA Q NIGHT (Patient not taking: Reported on 06/03/2024)     clopidogrel  (PLAVIX ) 75 MG tablet Take 1 tablet (75 mg total) by mouth daily. 90 tablet 3   ergocalciferol (VITAMIN D2) 1.25 MG (50000 UT) capsule Take 50,000 Units by mouth once a week.     glimepiride (AMARYL) 2 MG tablet Take 2 mg by mouth every morning.     hydrochlorothiazide (HYDRODIURIL) 12.5 MG tablet Take 12.5 mg by mouth daily.     Insulin  Pen Needle (BD PEN NEEDLE NANO U/F) 32G X 4  MM MISC Use one per day 30 each 5   isosorbide  mononitrate (IMDUR ) 30 MG 24 hr tablet Take 1 tablet (30 mg total) by mouth daily. 90 tablet 3   metoprolol tartrate (LOPRESSOR) 25 MG tablet Take 25 mg by mouth 2 (two) times daily.     nitrofurantoin , macrocrystal-monohydrate, (MACROBID ) 100 MG capsule Take 1 capsule (100 mg total) by mouth 2 (two) times daily. 10 capsule 0   nitroGLYCERIN  (NITROSTAT ) 0.4 MG SL tablet Place 1 tablet (0.4 mg total) under the tongue every 5 (five) minutes as needed. 25 tablet 3   tirzepatide (MOUNJARO) 2.5 MG/0.5ML Pen Inject 7.5 mg into the skin once a week. Pt states this was increased to 7.5 mg on 05/30/2024     No current facility-administered medications on file prior to visit.    Allergies  Allergen  Reactions   Codeine     hallucinations   Crestor  [Rosuvastatin ]    Jardiance [Empagliflozin]     Joint pain   Latex Hives    Assessment/Plan:  1. Hyperlipidemia -  Problem  Hyperlipidemia   Hyperlipidemia Assessment:  LDL goal: < 55  mg/dl; last LDLc 64 mg/dl (05/7973) (while on atorvastatin 80 mg) Intolerance to atorvastatin and rosuvastatin  Discussed next potential options (PCSK-9 inhibitors, bempedoic acid and inclisiran); cost, dosing efficacy, side effects; Patient prefers PCSK9i; patient has latex allergy  Discussed lifestyle modifications including aerobic exercise at least 150 minutes per week and importance of heart health diet and cutting back on carbohydrates Provided and discussed Planning healthy meals handout  Lipid panel and Lpa ordered on 07/2024 but patient never received, patient to obtain both today for baseline  Plan: Will apply for PA for Praluent will inform patient upon approval  Obtain Lpa and lipid panel today  Repeat Lipid lab and Lpa in 3 months after starting PCSK9i    Thank you,  Emilyann Banka E. Agostino Gorin, Pharm.D [place] [facility] BIR[facility]. [facility] [address], [place], [place] [address] Phone: [phone]; Fax: [phone]

## 2024-09-22 NOTE — Telephone Encounter (Signed)
 Called patient to inform her of $0/month copay for Praluent and confirmed preferred pharmacy. Reminded patient to repeat fasting lipid panel and Lpa in 3 months after starting Praluent. F/u labs already ordered. Prescription sent.

## 2024-09-22 NOTE — Assessment & Plan Note (Addendum)
 Assessment:  LDL goal: < 55  mg/dl; last LDLc 64 mg/dl (05/7973) (while on atorvastatin 80 mg) Intolerance to atorvastatin and rosuvastatin  Discussed next potential options (PCSK-9 inhibitors, bempedoic acid and inclisiran); cost, dosing efficacy, side effects; Patient prefers PCSK9i; patient has latex allergy  Discussed lifestyle modifications including aerobic exercise at least 150 minutes per week and importance of heart health diet and cutting back on carbohydrates Provided and discussed Planning healthy meals handout  Lipid panel and Lpa ordered on 07/2024 but patient never received, patient to obtain both today for baseline  Plan: Will apply for PA for Praluent will inform patient upon approval  Obtain Lpa and lipid panel today  Repeat Lipid lab and Lpa in 3 months after starting PCSK9i

## 2024-09-22 NOTE — Patient Instructions (Addendum)
 Your Results:             Your most recent labs Goal  Total Cholesterol 116 < 200  Triglycerides 94 < 150  HDL (happy/good cholesterol) 34 > 40  LDL (lousy/bad cholesterol 64 < 55   Medication changes: Please go for lipid panel and Lpa lab today We will start the process to get [id number] covered by your insurance.  Once the prior authorization is complete, we will call you to let you know and confirm pharmacy information.     Praluent is a cholesterol medication that improved your body's ability to get rid of bad cholesterol known as LDL. It can lower your LDL up to 60%. It is an injection that is given under the skin every 2 weeks. The most common side effects of Praluent include runny nose, symptoms of the common cold, rarely flu or flu-like symptoms, back/muscle pain in about 3-4% of the patients, and redness, pain, or bruising at the injection site.  Lab orders: We want to repeat labs after 3 months. I will contact you when to return for repeat lipid panel     Copay Assistance:  The [employer] foundation offers assistance to help pay for medication copays.  They will cover copays for all cholesterol lowering meds, including statins, fibrates, omega-3 oils, ezetimibe, Repatha, Praluent, Nexletol, Nexlizet.  The cards are usually good for $2,500 or 12 months, whichever comes first. Go to healthwellfoundation.org Click on "Apply Now" Answer questions as to whom is applying (patient or representative) Your disease fund will be "hypercholesterolemia - Medicare access" Select the cholesterol medication you need assistance with (Repatha, Praluent, Nexlizet...) They will ask question about qualifying diagnosis - you can mark yes; and do you have insurance coverage.   When they ask what type of assistance you are interested in - copay assistance When you submit, the approval is usually within minutes.  You will need to print the card information from the site You will need to show this  information to your pharmacy, they will bill your Medicare Part D plan first -then bill [employer] --for the copay.   You can also call them at [phone], although the hold times can be quite long.

## 2024-09-23 ENCOUNTER — Ambulatory Visit: Payer: Self-pay | Admitting: Internal Medicine

## 2024-09-23 LAB — LIPID PANEL
Chol/HDL Ratio: 5.5 ratio — ABNORMAL HIGH (ref 0.0–4.4)
Cholesterol, Total: 214 mg/dL — ABNORMAL HIGH (ref 100–199)
HDL: 39 mg/dL — ABNORMAL LOW (ref >39)
LDL Chol Calc (NIH): 147 mg/dL — ABNORMAL HIGH (ref 0–99)
Triglycerides: 155 mg/dL — ABNORMAL HIGH (ref 0–149)
VLDL Cholesterol Cal: 28 mg/dL (ref 5–40)

## 2024-09-23 LAB — LIPOPROTEIN A (LPA): Lipoprotein (a): 98.7 nmol/L — AB (ref <75.0)

## 2024-09-25 NOTE — Telephone Encounter (Signed)
 Please see other encounter.

## 2024-10-03 DIAGNOSIS — E1169 Type 2 diabetes mellitus with other specified complication: Secondary | ICD-10-CM | POA: Diagnosis not present

## 2024-10-03 DIAGNOSIS — E785 Hyperlipidemia, unspecified: Secondary | ICD-10-CM | POA: Diagnosis not present

## 2024-10-03 DIAGNOSIS — G720 Drug-induced myopathy: Secondary | ICD-10-CM | POA: Diagnosis not present

## 2024-11-04 NOTE — Progress Notes (Deleted)
 Cardiology Office Note:   Date:  11/04/2024  ID:  ELLICE BOULTINGHOUSE, DOB Aug 17, 1956, MRN [id number] PCP:  [name], MD  [facility] Providers Cardiologist:  [name], MD Referring MD: [name], MD  Chief Complaint/Reason for Referral:  CAD ASSESSMENT:    1. Coronary artery disease involving native coronary artery of native heart without angina pectoris   2. Diastolic dysfunction   3. Type 2 diabetes mellitus with complication, without long-term current use of insulin  (HCC)   4. Hypertension associated with diabetes (HCC)   5. Hyperlipidemia associated with type 2 diabetes mellitus (HCC)   6. Elevated lipoprotein(a)   7. Aortic atherosclerosis   8. CKD stage 2 due to type 2 diabetes mellitus (HCC)   9. BMI 30.0-30.9,adult      PLAN:   In order of problems listed above: CAD: Status post PCI of mid LAD; stop aspirin 81 mg and continue Plavix  5 mg monotherapy.  Continue Praluent  75 mg every 14 days, as needed nitroglycerin .  Continue Lopressor 25 mg twice daily given grade 1 diastolic dysfunction. Diastolic dysfunction: Continue losartan  25 mg, Lopressor 25 mg twice daily defer SGLT2 inhibitor.  If develops clinical heart failure would consider spironolactone and Toprol. T2DM: Continue aspirin 81 mg, Praluent  75 mg every 14 days, losartan  25 mg, and defer SGLT2 inhibitor due to intolerance Hypertension:   Continue hydrochlorothiazide 12.5 mg, Lopressor 25 mg twice daily Hyperlipidemia: Continue Praluent  75 mg every 14 days.  Check lipid panel today*** Elevated LP(a): Continue Praluent  75 mg every 14 days Aortic atherosclerosis: Continue Plavix  75 mg and Praluent  75 mg every 14 days  CKD stage II: Continue losartan  25 mg for renal protection.  Defer SGLT2 inhibitor Elevated BMI: Continue Mounjaro 2.5mg  qweekly.         Dispo:  No follow-ups on file.      Medication Adjustments/Labs and Tests Ordered: Current medicines are reviewed at length with the patient  today.  Concerns regarding medicines are outlined above.  The following changes have been made:     Labs/tests ordered: No orders of the defined types were placed in this encounter.   Medication Changes: No orders of the defined types were placed in this encounter.   Current medicines are reviewed at length with the patient today.  The patient does not have concerns regarding medicines.     History of Present Illness:    FOCUSED PROBLEM LIST:   CAD  Angina >> mLAD DES  March 2025 ([facility]) Small second diagonal was jailed + borderline disease OM 3; LVEDP Diastolic dysfunction G1 DD, no significant valve issues, EF 60 to 65% TTE July 2025 T2DM Not on insulin  Intolerant of Jardiance Hypertension Hyperlipidemia Intolerant of atorvastatin, rosuvastatin  LP(a) 98.7 Aortic atherosclerosis Chest CT 2025 CKD stage II BMI 02 June 2024:  Patient consents to use of AI scribe. The patient is a 68 year old female with the above listed medical problems referred to establish cardiovascular care.  The patient had a coronary CTA done in January which demonstrated a high-grade mid LAD lesion.  This was done due to exertional angina.  She underwent coronary angiography and PCI of her mid LAD in March 2025 at [facility].  She is maintained on aspirin and Plavix .  Post-procedure, she experiences occasional sharp, non-constant chest sensations described as 'a jolt or some type of little electrical thing' lasting a couple of seconds. No associated lightheadedness or syncope.  She has a history of diabetes and was previously on Jardiance, which led  to infections and exacerbated knee joint pain. She discontinued Jardiance and is currently on Mounjaro and glipizide. She notes swelling in her ankles, legs, and arms, which is unusual for her. Her home blood pressure readings are mostly in the 140s, with occasional readings in the 130s.  She experiences morning hand tightness that improves  with movement, attributing it to arthritis. 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 Atorvastatin, taken for cholesterol, disrupts her sleep by causing coughing, and she generally has poor sleep quality, leading to feelings of fatigue.  She is retired from agricultural consultant but continues to engineer, technical sales on occasion.  Plan: Start Crestor  20 mg, losartan  25 mg, check BMP in 1 week, check lipid panel, LFTs, LP(a) in 2 months.  Obtain echocardiogram  December 2025:  Patient consents to use of AI scribe. In the interim the patient informed us  that Crestor  could not be tolerated.  She was started on Praluent  by Pharm.D.      Current Medications: No outpatient medications have been marked as taking for the 11/13/24 encounter (Appointment) with Malachi Suderman K, MD.     Review of Systems:   Please see the history of present illness.    All other systems reviewed and are negative.     EKGs/Labs/Other Test Reviewed:   EKG: June 2025 sinus rhythm first-degree AV block  EKG Interpretation Date/Time:    Ventricular Rate:    PR Interval:    QRS Duration:    QT Interval:    QTC Calculation:   R Axis:      Text Interpretation:           Risk Assessment/Calculations:          Physical Exam:   VS:  There were no vitals taken for this visit.   No BP recorded.  {Refresh Note OR Click here to enter BP  :1}***   Wt Readings from Last 3 Encounters:  06/03/24 195 lb 12.3 oz (88.8 kg)  05/09/24 189 lb (85.7 kg)  06/29/23 180 lb (81.6 kg)      GENERAL:  No apparent distress, AOx3 HEENT:  No carotid bruits, +2 carotid impulses, no scleral icterus CAR: RRR no murmurs, gallops, rubs, or thrills RES:  Clear to auscultation bilaterally ABD:  Soft, nontender, nondistended, positive bowel sounds x 4 VASC:  +2 radial pulses, +2 carotid pulses NEURO:  CN 2-12 grossly intact; motor and sensory grossly intact PSYCH:  No active depression  or anxiety EXT:  No edema, ecchymosis, or cyanosis  Signed, Lurena MARLA Red, MD  11/04/2024 12:47 PM    [facility] [address] [place], [place], [place]  [address] Phone: [phone]; Fax: [phone]   Note:  This document was prepared using Dragon voice recognition software and may include unintentional dictation errors.

## 2024-11-13 ENCOUNTER — Ambulatory Visit: Admitting: Internal Medicine

## 2024-11-13 DIAGNOSIS — I700 Atherosclerosis of aorta: Secondary | ICD-10-CM

## 2024-11-13 DIAGNOSIS — E118 Type 2 diabetes mellitus with unspecified complications: Secondary | ICD-10-CM

## 2024-11-13 DIAGNOSIS — E1169 Type 2 diabetes mellitus with other specified complication: Secondary | ICD-10-CM

## 2024-11-13 DIAGNOSIS — Z6830 Body mass index (BMI) 30.0-30.9, adult: Secondary | ICD-10-CM

## 2024-11-13 DIAGNOSIS — I2510 Atherosclerotic heart disease of native coronary artery without angina pectoris: Secondary | ICD-10-CM

## 2024-11-13 DIAGNOSIS — I5189 Other ill-defined heart diseases: Secondary | ICD-10-CM

## 2024-11-13 DIAGNOSIS — E1122 Type 2 diabetes mellitus with diabetic chronic kidney disease: Secondary | ICD-10-CM

## 2024-11-13 DIAGNOSIS — E7841 Elevated Lipoprotein(a): Secondary | ICD-10-CM

## 2024-11-13 DIAGNOSIS — E1159 Type 2 diabetes mellitus with other circulatory complications: Secondary | ICD-10-CM

## 2024-11-17 NOTE — Progress Notes (Addendum)
 "  Cardiology Office Note:   Date:  11/18/2024  ID:  Kathleen Reid, DOB Apr 12, 1956, MRN [id number] PCP:  Kathleen Lacks, MD  [facility] Providers Cardiologist:  [name], MD Referring MD: Kathleen Lacks, MD  Chief Complaint/Reason for Referral:  CAD ASSESSMENT:    1. Coronary artery disease involving native coronary artery of native heart without angina pectoris   2. Diastolic dysfunction   3. Type 2 diabetes mellitus with complication, without long-term current use of insulin  (HCC)   4. Hypertension associated with diabetes (HCC)   5. Hyperlipidemia associated with type 2 diabetes mellitus (HCC)   6. Elevated lipoprotein(a)   7. Aortic atherosclerosis   8. CKD stage 2 due to type 2 diabetes mellitus (HCC)   9. BMI 30.0-30.9,adult       PLAN:   In order of problems listed above: CAD: Status post PCI of mid LAD; stop aspirin 81 mg and continue Plavix  75 mg monotherapy.  Continue Praluent  75 mg every 14 days, as needed nitroglycerin .  Continue Lopressor 25 mg twice daily given grade 1 diastolic dysfunction.  Given the patient's headaches I have asked her to stop her Imdur .  I think she was on Imdur  because of this jailed diagonal.  It is quite possible she no longer needs Imdur .  She will stop this.  If she develops any recurrent chest pain of asked her to restart the medication. Diastolic dysfunction: Start lisinopril  as below, Lopressor 25 mg twice daily defer SGLT2 inhibitor.  If develops clinical heart failure would consider spironolactone and Toprol. T2DM: Continue aspirin 81 mg, Praluent  75 mg every 14 days, start lisinopril  10 mg, and defer SGLT2 inhibitor due to intolerance Hypertension:   Continue hydrochlorothiazide  12.5 mg, Lopressor 25 mg twice daily, start lisinopril  10mg  bedtime, check BMP in one week.  BP at home remains 130s to 140s systolic which is above goal. Hyperlipidemia: LDL in October was 147.  Continue Praluent  75 mg every 14 days.  She just restarted  Praluent  about a week or 2 ago.  Will check lipid panel in 1 month. Elevated LP(a): Continue Praluent  75 mg every 14 days; goal LDL less than 55 Aortic atherosclerosis: Continue Plavix  75 mg and Praluent  75 mg every 14 days  CKD stage II: Start lisinopril  10mg  for renal protection.  Defer SGLT2 inhibitor Elevated BMI: Continue Mounjaro 2.5mg  qweekly.         Dispo:  Return in about 6 months (around 05/19/2025).      Medication Adjustments/Labs and Tests Ordered: Current medicines are reviewed at length with the patient today.  Concerns regarding medicines are outlined above.  The following changes have been made:     Labs/tests ordered: Orders Placed This Encounter  Procedures   Basic Metabolic Panel (BMET)   Lipid panel    Medication Changes: Meds ordered this encounter  Medications   isosorbide  mononitrate (IMDUR ) 30 MG 24 hr tablet    Sig: Take 1 tablet (30 mg total) by mouth daily.    Dispense:  90 tablet    Refill:  3   lisinopril  (ZESTRIL ) 10 MG tablet    Sig: Take 1 tablet (10 mg total) by mouth daily.    Dispense:  30 tablet    Refill:  3    Current medicines are reviewed at length with the patient today.  The patient does not have concerns regarding medicines.     History of Present Illness:    FOCUSED PROBLEM LIST:   CAD  Angina >> mLAD  DES  March 2025 ([facility]) Small second diagonal was jailed + borderline disease OM 3; LVEDP Diastolic dysfunction G1 DD, no significant valve issues, EF 60 to 65% TTE July 2025 T2DM Not on insulin  Intolerant of Jardiance Intolerant of ARB >> disorientation Hypertension Hyperlipidemia Intolerant of atorvastatin, rosuvastatin  LP(a) 98.7 Aortic atherosclerosis Chest CT 2025 CKD stage II BMI 02 June 2024:  Patient consents to use of AI scribe. The patient is a 68 year old female with the above listed medical problems referred to establish cardiovascular care.  The patient had a coronary CTA done in January  which demonstrated a high-grade mid LAD lesion.  This was done due to exertional angina.  She underwent coronary angiography and PCI of her mid LAD in March 2025 at [facility].  She is maintained on aspirin and Plavix .  Post-procedure, she experiences occasional sharp, non-constant chest sensations described as 'a jolt or some type of little electrical thing' lasting a couple of seconds. No associated lightheadedness or syncope.  She has a history of diabetes and was previously on Jardiance, which led to infections and exacerbated knee joint pain. She discontinued Jardiance and is currently on Mounjaro and glipizide. She notes swelling in her ankles, legs, and arms, which is unusual for her. Her home blood pressure readings are mostly in the 140s, with occasional readings in the 130s.  She experiences morning hand tightness that improves with movement, attributing it to arthritis. 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 Atorvastatin, taken for cholesterol, disrupts her sleep by causing coughing, and she generally has poor sleep quality, leading to feelings of fatigue.  She is retired from agricultural consultant but continues to engineer, technical sales on occasion.  Plan: Start Crestor  20 mg, losartan  25 mg, check BMP in 1 week, check lipid panel, LFTs, LP(a) in 2 months.  Obtain echocardiogram  December 2025:  Patient consents to use of AI scribe. In the interim the patient informed us  that Crestor  could not be tolerated.  She was started on Praluent  by Pharm.D. and has been on this for only a couple of weeks.  She is doing well.  She denies any chest pain, significant shortness of breath, paroxysmal atrial dyspnea, orthopnea.  She occasionally gets an itching sensation.  She is unsure whether this is related to a medication or not.  She takes her Plavix  at bedtime.  Sometimes she awakes from sleep with some itching.  She also gets headaches.  She is taking  Imdur  at bedtime.  She is otherwise well without complaints.      Current Medications: Current Meds  Medication Sig   ACCU-CHEK SMARTVIEW test strip TEST three times a day   Alirocumab  (PRALUENT ) 75 MG/ML SOAJ Inject 1 mL (75 mg total) into the skin every 14 (fourteen) days.   aspirin 81 MG chewable tablet Chew 81 mg by mouth daily.   clobetasol (TEMOVATE) 0.05 % external solution APP 1 ML EXT AA Q NIGHT (Patient taking differently: Apply 1 Application topically as needed.)   clopidogrel  (PLAVIX ) 75 MG tablet Take 1 tablet (75 mg total) by mouth daily.   ergocalciferol (VITAMIN D2) 1.25 MG (50000 UT) capsule Take 50,000 Units by mouth once a week.   hydrochlorothiazide  (HYDRODIURIL ) 12.5 MG tablet Take 12.5 mg by mouth daily. (Patient taking differently: Take 12.5 mg by mouth daily as needed.)   Insulin  Pen Needle (BD PEN NEEDLE NANO U/F) 32G X 4 MM MISC Use one per day  lisinopril  (ZESTRIL ) 10 MG tablet Take 1 tablet (10 mg total) by mouth daily.   metoprolol tartrate (LOPRESSOR) 25 MG tablet Take 25 mg by mouth 2 (two) times daily. (Patient taking differently: Take 25 mg by mouth 2 (two) times daily as needed.)   nitroGLYCERIN  (NITROSTAT ) 0.4 MG SL tablet Place 1 tablet (0.4 mg total) under the tongue every 5 (five) minutes as needed.   tirzepatide (MOUNJARO) 2.5 MG/0.5ML Pen Inject 7.5 mg into the skin once a week. Pt states this was increased to 7.5 mg on 05/30/2024 (Patient taking differently: Inject 10 mg into the skin once a week. Pt states this was increased to 7.5 mg on 05/30/2024)   [DISCONTINUED] isosorbide  mononitrate (IMDUR ) 30 MG 24 hr tablet Take 1 tablet (30 mg total) by mouth daily.     Review of Systems:   Please see the history of present illness.    All other systems reviewed and are negative.     EKGs/Labs/Other Test Reviewed:   EKG: June 2025 sinus rhythm first-degree AV block  EKG Interpretation Date/Time:    Ventricular Rate:    PR Interval:    QRS Duration:     QT Interval:    QTC Calculation:   R Axis:      Text Interpretation:           Risk Assessment/Calculations:          Physical Exam:   VS:  BP 136/64 (BP Location: Right Arm, Patient Position: Sitting, Cuff Size: Large)   Pulse 84   Ht 5' 6 (1.676 m)   Wt 190 lb (86.2 kg)   BMI 30.67 kg/m        Wt Readings from Last 3 Encounters:  11/18/24 190 lb (86.2 kg)  06/03/24 195 lb 12.3 oz (88.8 kg)  05/09/24 189 lb (85.7 kg)      GENERAL:  No apparent distress, AOx3 HEENT:  No carotid bruits, +2 carotid impulses, no scleral icterus CAR: RRR no murmurs, gallops, rubs, or thrills RES:  Clear to auscultation bilaterally ABD:  Soft, nontender, nondistended, positive bowel sounds x 4 VASC:  +2 radial pulses, +2 carotid pulses NEURO:  CN 2-12 grossly intact; motor and sensory grossly intact PSYCH:  No active depression or anxiety EXT:  No edema, ecchymosis, or cyanosis  Signed, Lurena MARLA Red, MD  11/18/2024 9:26 AM    [facility] [address] [place], [place], [place]  [address] Phone: [phone]; Fax: [phone]   Note:  This document was prepared using Dragon voice recognition software and may include unintentional dictation errors. "

## 2024-11-18 ENCOUNTER — Ambulatory Visit: Attending: Internal Medicine | Admitting: Internal Medicine

## 2024-11-18 ENCOUNTER — Encounter: Payer: Self-pay | Admitting: Internal Medicine

## 2024-11-18 VITALS — BP 136/64 | HR 84 | Ht 66.0 in | Wt 190.0 lb

## 2024-11-18 DIAGNOSIS — E1122 Type 2 diabetes mellitus with diabetic chronic kidney disease: Secondary | ICD-10-CM

## 2024-11-18 DIAGNOSIS — N182 Chronic kidney disease, stage 2 (mild): Secondary | ICD-10-CM

## 2024-11-18 DIAGNOSIS — E785 Hyperlipidemia, unspecified: Secondary | ICD-10-CM

## 2024-11-18 DIAGNOSIS — I152 Hypertension secondary to endocrine disorders: Secondary | ICD-10-CM

## 2024-11-18 DIAGNOSIS — I2510 Atherosclerotic heart disease of native coronary artery without angina pectoris: Secondary | ICD-10-CM

## 2024-11-18 DIAGNOSIS — E1159 Type 2 diabetes mellitus with other circulatory complications: Secondary | ICD-10-CM

## 2024-11-18 DIAGNOSIS — E118 Type 2 diabetes mellitus with unspecified complications: Secondary | ICD-10-CM

## 2024-11-18 DIAGNOSIS — E7841 Elevated Lipoprotein(a): Secondary | ICD-10-CM

## 2024-11-18 DIAGNOSIS — Z6830 Body mass index (BMI) 30.0-30.9, adult: Secondary | ICD-10-CM

## 2024-11-18 DIAGNOSIS — I700 Atherosclerosis of aorta: Secondary | ICD-10-CM

## 2024-11-18 DIAGNOSIS — E1169 Type 2 diabetes mellitus with other specified complication: Secondary | ICD-10-CM

## 2024-11-18 DIAGNOSIS — I5189 Other ill-defined heart diseases: Secondary | ICD-10-CM

## 2024-11-18 MED ORDER — LISINOPRIL 10 MG PO TABS: 10.0000 mg | ORAL_TABLET | Freq: Every day | ORAL | 3 refills | Status: DC

## 2024-11-18 MED ORDER — ISOSORBIDE MONONITRATE ER 30 MG PO TB24: 30.0000 mg | ORAL_TABLET | Freq: Every day | ORAL | 3 refills | Status: AC

## 2024-11-18 NOTE — Patient Instructions (Addendum)
 Medication Instructions:  STOP Aspirin   START Lisinopril  10 mg once daily   Refill for Isosorbide  Mononitrate (Imdur ) has been sent to your pharmacy.  *If you need a refill on your cardiac medications before your next appointment, please call your pharmacy*  Lab Work: To be completed in 1 week: BMP (approximately 11/25/24)  To be completed in 1 month: lipid panel (approximately 12/20/23)  If you have labs (blood work) drawn today and your tests are completely normal, you will receive your results only by: MyChart Message (if you have MyChart) OR A paper copy in the mail If you have any lab test that is abnormal or we need to change your treatment, we will call you to review the results.  Testing/Procedures: None ordered today.  Follow-Up: At [facility], you and your health needs are our priority.  As part of our continuing mission to provide you with exceptional heart care, our providers are all part of one team.  This team includes your primary Cardiologist (physician) and Advanced Practice Providers or APPs (Physician Assistants and Nurse Practitioners) who all work together to provide you with the care you need, when you need it.  Your next appointment:   6 month(s)  Provider:   One of our Advanced Practice Providers (APPs): Morse Clause, PA-C  Lamarr Satterfield, NP Miriam Shams, NP  Olivia Pavy, PA-C Josefa Beauvais, NP  Leontine Salen, PA-C Orren Fabry, PA-C  [place], PA-C Ernest Dick, NP  Damien Braver, NP Jon Hails, PA-C  Waddell Donath, PA-C    Dayna Dunn, PA-C  Scott Weaver, PA-C Lum Louis, NP Katlyn West, NP Callie Goodrich, PA-C  Xika Zhao, NP Sheng Haley, PA-C    Kathleen Johnson, PA-C

## 2024-11-26 ENCOUNTER — Ambulatory Visit (HOSPITAL_COMMUNITY): Payer: Self-pay

## 2024-11-26 ENCOUNTER — Encounter (HOSPITAL_COMMUNITY): Payer: Self-pay | Admitting: *Deleted

## 2024-11-26 ENCOUNTER — Ambulatory Visit (HOSPITAL_COMMUNITY)
Admission: EM | Admit: 2024-11-26 | Discharge: 2024-11-26 | Disposition: A | Discharge status: Home / Self Care | Attending: Family Medicine | Admitting: Family Medicine

## 2024-11-26 DIAGNOSIS — L299 Pruritus, unspecified: Secondary | ICD-10-CM | POA: Diagnosis not present

## 2024-11-26 LAB — COMPREHENSIVE METABOLIC PANEL WITH GFR
ALT: 25 U/L (ref 0–44)
AST: 27 U/L (ref 15–41)
Albumin: 4.3 g/dL (ref 3.5–5.0)
Alkaline Phosphatase: 98 U/L (ref 38–126)
Anion gap: 11 (ref 5–15)
BUN: 12 mg/dL (ref 8–23)
CO2: 27 mmol/L (ref 22–32)
Calcium: 9.4 mg/dL (ref 8.9–10.3)
Chloride: 99 mmol/L (ref 98–111)
Creatinine, Ser: 0.71 mg/dL (ref 0.44–1.00)
GFR, Estimated: >60 mL/min
Glucose, Bld: 187 mg/dL — ABNORMAL HIGH (ref 70–99)
Potassium: 4.2 mmol/L (ref 3.5–5.1)
Sodium: 136 mmol/L (ref 135–145)
Total Bilirubin: 0.3 mg/dL (ref 0.0–1.2)
Total Protein: 7.5 g/dL (ref 6.5–8.1)

## 2024-11-26 LAB — TSH: TSH: 0.945 u[IU]/mL (ref 0.350–4.500)

## 2024-11-26 LAB — CBC
HCT: 38.6 % (ref 36.0–46.0)
Hemoglobin: 12.6 g/dL (ref 12.0–15.0)
MCH: 27.5 pg (ref 26.0–34.0)
MCHC: 32.6 g/dL (ref 30.0–36.0)
MCV: 84.1 fL (ref 80.0–100.0)
Platelets: 360 K/uL (ref 150–400)
RBC: 4.59 MIL/uL (ref 3.87–5.11)
RDW: 13.6 % (ref 11.5–15.5)
WBC: 5.3 K/uL (ref 4.0–10.5)
nRBC: 0 % (ref 0.0–0.2)

## 2024-11-26 MED ORDER — HYDROXYZINE HCL 25 MG PO TABS: 25.0000 mg | ORAL_TABLET | Freq: Four times a day (QID) | ORAL | 0 refills | Status: AC | PRN

## 2024-11-26 MED ORDER — PREDNISONE 20 MG PO TABS: 40.0000 mg | ORAL_TABLET | Freq: Every day | ORAL | 0 refills | Status: AC

## 2024-11-26 NOTE — ED Provider Notes (Signed)
 " [facility]   [id number] 11/26/24 Arrival Time: 0949  ASSESSMENT & PLAN:  1. Pruritus    Unclear cause.  Labs Pending:  CBC  COMPREHENSIVE METABOLIC PANEL WITH GFR  TSH   Trial of: Meds ordered this encounter  Medications   predniSONE  (DELTASONE ) 20 MG tablet    Sig: Take 2 tablets (40 mg total) by mouth daily.    Dispense:  10 tablet    Refill:  0   hydrOXYzine  (ATARAX ) 25 MG tablet    Sig: Take 1 tablet (25 mg total) by mouth every 6 (six) hours as needed.    Dispense:  20 tablet    Refill:  0     Follow-up Information     Schedule an appointment as soon as possible for a visit  with [name], MD.   Specialty: Family Medicine Why: For follow up. Contact information: [address] [place] [address] [phone]                 Reviewed expectations re: course of current medical issues. Questions answered. Outlined signs and symptoms indicating need for more acute intervention. Understanding verbalized. After Visit Summary given.   SUBJECTIVE: History from: Patient. Kathleen Reid is a 68 y.o. female. Pt states she is itching all over her body since yesterday, she took benadryl  last night with some help.  Pt states only new med is lisinopril  but she thinks the itching started before she took it. Denies recent travel. Denies: fever and recent illness. Normal PO intake without n/v/d.  OBJECTIVE:  Vitals:   11/26/24 1125  BP: (!) 143/77  Pulse: 83  Resp: 16  Temp: 98.8 F (37.1 C)  TempSrc: Oral  SpO2: 97%    General appearance: alert; no distress Eyes: PERRLA; EOMI; conjunctiva normal HENT: [place]; AT; without nasal congestion Neck: supple  Lungs: speaks full sentences without difficulty; unlabored Extremities: no edema Skin: warm and dry; no rashes Neurologic: normal gait Psychological: alert and cooperative; normal mood and affect  Labs:  Labs Reviewed  CBC  COMPREHENSIVE METABOLIC PANEL WITH GFR  TSH     Imaging: No results found.  Allergies[1]  Past Medical History:  Diagnosis Date   Diabetes mellitus without complication (HCC)    Eye disease    HLD (hyperlipidemia)    Social History   Socioeconomic History   Marital status: Divorced    Spouse name: Not on file   Number of children: Not on file   Years of education: Not on file   Highest education level: Not on file  Occupational History   Not on file  Tobacco Use   Smoking status: Never   Smokeless tobacco: Never  Vaping Use   Vaping status: Never Used  Substance and Sexual Activity   Alcohol use: No   Drug use: No   Sexual activity: Not on file  Other Topics Concern   Not on file  Social History Narrative   Not on file   Social Drivers of Health   Tobacco Use: Low Risk (11/18/2024)   Patient History    Smoking Tobacco Use: Never    Smokeless Tobacco Use: Never    Passive Exposure: Not on file  Financial Resource Strain: Not on file  Food Insecurity: Low Risk (02/05/2024)   Received from [facility]   Epic    Within the past 12 months, you worried that your food would run out before you got money to buy more: Never true  Within the past 12 months, the food you bought just didn't last and you didn't have money to get more. : Never true  Transportation Needs: No Transportation Needs (02/05/2024)   Received from [facility]    In the past 12 months, has lack of reliable transportation kept you from medical appointments, meetings, work or from getting things needed for daily living? : No  Physical Activity: Not on file  Stress: Not on file  Social Connections: Unknown (04/14/2022)   Received from [facility]   Social Network    Social Network: Not on file  Intimate Partner Violence: Unknown (03/06/2022)   Received from [facility]   HITS    Physically Hurt: Not on file    Insult or Talk Down To: Not on file    Threaten Physical Harm: Not on file    Scream or Curse: Not on file   Depression (PHQ2-9): Medium Risk (06/03/2024)   Depression (PHQ2-9)    PHQ-2 Score: 7  Alcohol Screen: Not on file  Housing: Low Risk (02/05/2024)   Received from [facility]   Epic    What is your living situation today?: I have a steady place to live    Think about the place you live. Do you have problems with any of the following? Choose all that apply:: None/None on this list  Utilities: Low Risk (02/05/2024)   Received from [facility]   Utilities    In the past 12 months has the electric, gas, oil, or water company threatened to shut off services in your home? : No  Health Literacy: Not on file   Family History  Problem Relation Age of Onset   Diabetes Mother    Heart disease Mother    Diabetes Father    Heart disease Father    Diabetes Sister    Heart disease Sister    Past Surgical History:  Procedure Laterality Date   ABDOMINAL HYSTERECTOMY     BREAST BIOPSY     KNEE SURGERY     REDUCTION MAMMAPLASTY        [1]  Allergies Allergen Reactions   Losartan  Other (See Comments)    Disorientation    Codeine     hallucinations   Crestor  [Rosuvastatin ]    Jardiance [Empagliflozin]     Joint pain   Latex Zina Rolinda Rogue, MD 11/26/24 1303  "

## 2024-11-26 NOTE — Discharge Instructions (Addendum)
 Be aware, your blood sugars will run higher than normal while taking prednisone . Monitor closely.  You have had labs (blood work) sent today. We will call you with any significant abnormalities or if there is need to begin or change treatment or pursue further follow up.  You may also review your test results online through MyChart. If you do not have a MyChart account, instructions to sign up should be on your discharge paperwork.

## 2024-11-26 NOTE — ED Triage Notes (Addendum)
 Pt states she is itching all over her body since yesterday, she took benadryl  last night.   Pt states only new med is lisinopril  but she thinks the itching started before she took it.

## 2024-12-12 ENCOUNTER — Telehealth: Payer: Self-pay | Admitting: Internal Medicine

## 2024-12-12 NOTE — Telephone Encounter (Signed)
 Called patient back about her message. Patient stated every time she taking lisinopril  her lips swell. Patient stated the last time she took it was on Wednesday, she also had itching that day. Encouraged patient to stop taking her lisinopril . Will send message to Dr. Wendel to see what else she can take. Patient's most recent BP 148/84, but SBP has been as high as 161. Patient stated she is open to [facility]. Will see what Dr. Wendel suggest.

## 2024-12-12 NOTE — Telephone Encounter (Signed)
 Pt c/o medication issue:  1. Name of Medication: lisinopril  (ZESTRIL ) 10 MG tablet   2. How are you currently taking this medication (dosage and times per day)? As written   3. Are you having a reaction (difficulty breathing--STAT)? No  4. What is your medication issue? Pt states this medication is causing her to itch all over and her lips to swell up.

## 2024-12-13 ENCOUNTER — Encounter: Payer: Self-pay | Admitting: Internal Medicine

## 2024-12-13 NOTE — Telephone Encounter (Signed)
 I spoke with patient this morning regarding lip swelling with lisinopril .  I instructed her to no longer take this medication, that we would be adding this to her allergy  list, and that staff would be in contact on Monday regarding adjusting her hydrochlorothiazide  dose.  She understands and agrees with this plan.  All questions. Answered.

## 2024-12-15 ENCOUNTER — Other Ambulatory Visit: Payer: Self-pay

## 2024-12-15 MED ORDER — HYDROCHLOROTHIAZIDE 25 MG PO TABS: 25.0000 mg | ORAL_TABLET | Freq: Every day | ORAL | 3 refills | Status: AC

## 2024-12-15 NOTE — Telephone Encounter (Signed)
 MyChart message sent to pt on original message thread letting pt know of the hydrochlorothiazide  increase to 25 mg once daily and to also let her know we placed Lisinopril  on her allergy  list. Pt told to contact our office with any questions.

## 2024-12-25 ENCOUNTER — Telehealth: Payer: Self-pay

## 2024-12-25 NOTE — Telephone Encounter (Signed)
 Called patient to remind her to obtain updated Lpa and lipid panel. Orders placed. NR-LVM. Will send mychart message as well.

## 2025-02-27 ENCOUNTER — Ambulatory Visit: Admitting: Cardiology
